# Patient Record
Sex: Female | Born: 1980 | Race: White | Hispanic: No | Marital: Single | Smoking: Never smoker
Health system: Southern US, Community
[De-identification: ages and names within clinical notes are randomized; demographics above are authoritative.]

## PROBLEM LIST (undated history)

## (undated) DIAGNOSIS — E039 Hypothyroidism, unspecified: Secondary | ICD-10-CM

## (undated) DIAGNOSIS — I10 Essential (primary) hypertension: Secondary | ICD-10-CM

## (undated) MED FILL — Ferumoxytol Inj 510 MG/17ML (30 MG/ML) (Elemental Fe): INTRAVENOUS | Qty: 17 | Status: AC

---

## 2013-06-10 ENCOUNTER — Emergency Department (HOSPITAL_COMMUNITY): Payer: Self-pay

## 2013-06-10 ENCOUNTER — Encounter (HOSPITAL_COMMUNITY): Payer: Self-pay | Admitting: *Deleted

## 2013-06-10 ENCOUNTER — Emergency Department (HOSPITAL_COMMUNITY)
Admission: EM | Admit: 2013-06-10 | Discharge: 2013-06-10 | Disposition: A | Discharge status: Home / Self Care | Payer: Self-pay | Attending: Emergency Medicine | Admitting: Emergency Medicine

## 2013-06-10 DIAGNOSIS — L02419 Cutaneous abscess of limb, unspecified: Secondary | ICD-10-CM | POA: Insufficient documentation

## 2013-06-10 DIAGNOSIS — W2209XA Striking against other stationary object, initial encounter: Secondary | ICD-10-CM | POA: Insufficient documentation

## 2013-06-10 DIAGNOSIS — Y939 Activity, unspecified: Secondary | ICD-10-CM | POA: Insufficient documentation

## 2013-06-10 DIAGNOSIS — Y929 Unspecified place or not applicable: Secondary | ICD-10-CM | POA: Insufficient documentation

## 2013-06-10 DIAGNOSIS — L0390 Cellulitis, unspecified: Secondary | ICD-10-CM

## 2013-06-10 MED ORDER — CEPHALEXIN 500 MG PO CAPS: 500.0000 mg | ORAL_CAPSULE | Freq: Four times a day (QID) | ORAL | Status: DC

## 2013-06-10 MED ORDER — SULFAMETHOXAZOLE-TRIMETHOPRIM 800-160 MG PO TABS: 1.0000 | ORAL_TABLET | Freq: Two times a day (BID) | ORAL | Status: DC

## 2013-06-10 NOTE — ED Notes (Signed)
Pt hit left leg against couch, continue to have swelling and legpain

## 2013-06-10 NOTE — ED Provider Notes (Addendum)
CSN: [id number]     Arrival date & time 06/10/13  2114 History     First MD Initiated Contact with Patient 06/10/13 2147     Chief Complaint  Patient presents with  . Leg Injury   (Consider location/radiation/quality/duration/timing/severity/associated sxs/prior Treatment) HPI This is a 32 year old female with no significant past history who presents with left leg pain. Patient states that she hit her left shin on a couch last week. Since that time she has had increasing leg pain and swelling. She has been ambulatory.  At the time of injury, she sustained a small abrasion and states it has gotten more red. She denies any fevers.  History reviewed. No pertinent past medical history. History reviewed. No pertinent past surgical history. No family history on file. History  Substance Use Topics  . Smoking status: Never Smoker   . Smokeless tobacco: Not on file  . Alcohol Use: No   OB History   Grav Para Term Preterm Abortions TAB SAB Ect Mult Living                 Review of Systems  Constitutional: Negative for fever.  Musculoskeletal: Negative for gait problem.  Neurological: Negative for weakness.  All other systems reviewed and are negative.    Allergies  Review of patient's allergies indicates no known allergies.  Home Medications   Current Outpatient Rx  Name  Route  Sig  Dispense  Refill  . ibuprofen (ADVIL,MOTRIN) 200 MG tablet   Oral   Take 200 mg by mouth every 6 (six) hours as needed for pain (pain).         . cephALEXin (KEFLEX) 500 MG capsule   Oral   Take 1 capsule (500 mg total) by mouth 4 (four) times daily.   28 capsule   0   . sulfamethoxazole-trimethoprim (SEPTRA DS) 800-160 MG per tablet   Oral   Take 1 tablet by mouth 2 (two) times daily.   28 tablet   0    BP 168/111  Pulse 74  Temp(Src) 98.4 F (36.9 C) (Oral)  Ht 5\' 2"  (1.575 m)  Wt 268 lb (121.564 kg)  BMI 49.01 kg/m2  SpO2 100%  LMP 04/04/2013 Physical Exam  Nursing note  and vitals reviewed. Constitutional: She is oriented to person, place, and time. She appears well-developed and well-nourished.  HENT:  Head: Normocephalic and atraumatic.  Neck: Neck supple.  Cardiovascular: Normal rate, regular rhythm and normal heart sounds.   Pulmonary/Chest: Effort normal. No respiratory distress. She has no wheezes.  Musculoskeletal: Normal range of motion.  No deformity noted to the left.  Neurological: She is alert and oriented to person, place, and time.  Skin: Skin is warm and dry. There is erythema.  Patient has a small area of erythema and swelling in the anterior shin associated with a small abrasion. The skin is warm.    Psychiatric: She has a normal mood and affect.    ED Course   Procedures (including critical care time)  Labs Reviewed - No data to display Dg Tibia/fibula Left  06/10/2013   *RADIOLOGY REPORT*  Clinical Data: Left leg injury, pain.  LEFT TIBIA AND FIBULA - 2 VIEW  Comparison: None  Findings: No acute bony abnormality.  Specifically, no fracture, subluxation, or dislocation.  Soft tissues are intact.  IMPRESSION: Negative.   Original Report Authenticated By: Charlett Nose, M.D.   1. Cellulitis     MDM  This is a 32 year old female who presents with leg  pain and swelling. She is nontoxic-appearing on exam and her vital signs are reassuring. If patient has evidence of abrasion with what is likely early cellulitis over the left shin. Plain films of this area are negative. Patient is afebrile. Given that she has had increased pain in her physical exam is concerning for infection, I will send her home on Bactrim and Keflex for 7 days. She will followup with her primary care physician.  After history, exam, and medical workup I feel the patient has been appropriately medically screened and is safe for discharge home. Pertinent diagnoses were discussed with the patient. Patient was given return precautions.   Shon Baton, MD 06/10/13  4098  Shon Baton, MD 06/10/13 [phone]

## 2013-06-10 NOTE — ED Notes (Signed)
Elevated left leg.

## 2013-10-28 ENCOUNTER — Encounter (HOSPITAL_BASED_OUTPATIENT_CLINIC_OR_DEPARTMENT_OTHER): Payer: Self-pay | Admitting: Emergency Medicine

## 2013-10-28 ENCOUNTER — Emergency Department (HOSPITAL_BASED_OUTPATIENT_CLINIC_OR_DEPARTMENT_OTHER): Payer: Self-pay

## 2013-10-28 ENCOUNTER — Emergency Department (HOSPITAL_BASED_OUTPATIENT_CLINIC_OR_DEPARTMENT_OTHER)
Admission: EM | Admit: 2013-10-28 | Discharge: 2013-10-28 | Disposition: A | Discharge status: Home / Self Care | Payer: Self-pay | Attending: Emergency Medicine | Admitting: Emergency Medicine

## 2013-10-28 DIAGNOSIS — S59909A Unspecified injury of unspecified elbow, initial encounter: Secondary | ICD-10-CM | POA: Insufficient documentation

## 2013-10-28 DIAGNOSIS — Y9389 Activity, other specified: Secondary | ICD-10-CM | POA: Insufficient documentation

## 2013-10-28 DIAGNOSIS — S59919A Unspecified injury of unspecified forearm, initial encounter: Principal | ICD-10-CM

## 2013-10-28 DIAGNOSIS — S6992XA Unspecified injury of left wrist, hand and finger(s), initial encounter: Secondary | ICD-10-CM

## 2013-10-28 DIAGNOSIS — W19XXXA Unspecified fall, initial encounter: Secondary | ICD-10-CM

## 2013-10-28 DIAGNOSIS — S6990XA Unspecified injury of unspecified wrist, hand and finger(s), initial encounter: Principal | ICD-10-CM | POA: Insufficient documentation

## 2013-10-28 DIAGNOSIS — Z79899 Other long term (current) drug therapy: Secondary | ICD-10-CM | POA: Insufficient documentation

## 2013-10-28 DIAGNOSIS — E039 Hypothyroidism, unspecified: Secondary | ICD-10-CM | POA: Insufficient documentation

## 2013-10-28 DIAGNOSIS — R296 Repeated falls: Secondary | ICD-10-CM | POA: Insufficient documentation

## 2013-10-28 DIAGNOSIS — Y929 Unspecified place or not applicable: Secondary | ICD-10-CM | POA: Insufficient documentation

## 2013-10-28 HISTORY — DX: Hypothyroidism, unspecified: E03.9

## 2013-10-28 MED ORDER — TRAMADOL HCL 50 MG PO TABS: 50.0000 mg | ORAL_TABLET | Freq: Four times a day (QID) | ORAL | Status: DC | PRN

## 2013-10-28 NOTE — Discharge Instructions (Signed)
Keep splint intact until Orthopedic follow up. Follow up with Dr. Tamera Punt for further evaluation of your possible fracture. Take Tramadol as needed for pain. Refer to attached documents for more information. Rest, ice and elevate your injury.

## 2013-10-28 NOTE — ED Provider Notes (Signed)
CSN: [id number]     Arrival date & time 10/28/13  1731 History   First MD Initiated Contact with Patient 10/28/13 1906     Chief Complaint  Patient presents with  . Wrist Pain   (Consider location/radiation/quality/duration/timing/severity/associated sxs/prior Treatment) HPI Comments: Patient is a 33 year old female who presents after a [place] that occurred last night when she fell off a stool. Since the fall, patient reports left wrist pain that is throbbing and severe without radiation. Movement of the wrist makes the pain worse. No alleviating factors. Patient denies any other injuries. Patient denies head trauma or LOC.   Patient is a 33 y.o. female presenting with wrist pain.  Wrist Pain Associated symptoms include arthralgias and joint swelling. Pertinent negatives include no abdominal pain, chest pain, chills, fatigue, fever, nausea, neck pain, vomiting or weakness.    Past Medical History  Diagnosis Date  . Hypothyroidism    History reviewed. No pertinent past surgical history. No family history on file. History  Substance Use Topics  . Smoking status: Never Smoker   . Smokeless tobacco: Not on file  . Alcohol Use: No   OB History   Grav Para Term Preterm Abortions TAB SAB Ect Mult Living                 Review of Systems  Constitutional: Negative for fever, chills and fatigue.  HENT: Negative for trouble swallowing.   Eyes: Negative for visual disturbance.  Respiratory: Negative for shortness of breath.   Cardiovascular: Negative for chest pain and palpitations.  Gastrointestinal: Negative for nausea, vomiting, abdominal pain and diarrhea.  Genitourinary: Negative for dysuria and difficulty urinating.  Musculoskeletal: Positive for arthralgias and joint swelling. Negative for neck pain.  Skin: Negative for color change.  Neurological: Negative for dizziness and weakness.  Psychiatric/Behavioral: Negative for dysphoric mood.    Allergies  Review of patient's  allergies indicates no known allergies.  Home Medications   Current Outpatient Rx  Name  Route  Sig  Dispense  Refill  . levothyroxine (SYNTHROID, LEVOTHROID) 75 MCG tablet   Oral   Take 75 mcg by mouth daily before breakfast.         . cephALEXin (KEFLEX) 500 MG capsule   Oral   Take 1 capsule (500 mg total) by mouth 4 (four) times daily.   28 capsule   0   . ibuprofen (ADVIL,MOTRIN) 200 MG tablet   Oral   Take 200 mg by mouth every 6 (six) hours as needed for pain (pain).         Marland Kitchen sulfamethoxazole-trimethoprim (SEPTRA DS) 800-160 MG per tablet   Oral   Take 1 tablet by mouth 2 (two) times daily.   28 tablet   0    BP 158/109  Pulse 78  Temp(Src) 98.3 F (36.8 C) (Oral)  Resp 20  Ht 5\' 2"  (1.575 m)  Wt 250 lb (113.399 kg)  BMI 45.71 kg/m2  SpO2 98% Physical Exam  Nursing note and vitals reviewed. Constitutional: She is oriented to person, place, and time. She appears well-developed and well-nourished. No distress.  HENT:  Head: Normocephalic and atraumatic.  Eyes: Conjunctivae and EOM are normal.  Neck: Normal range of motion.  Cardiovascular: Normal rate and regular rhythm.  Exam reveals no gallop and no friction rub.   No murmur heard. Pulmonary/Chest: Effort normal and breath sounds normal. She has no wheezes. She has no rales. She exhibits no tenderness.  Musculoskeletal:  Limited ROM of left wrist  due to pain. No obvious deformity. Left snuff box tenderness. Full ROM of digits of left hand.   Neurological: She is alert and oriented to person, place, and time. Coordination normal.  Speech is goal-oriented. Moves limbs without ataxia.   Skin: Skin is warm and dry.  Psychiatric: She has a normal mood and affect. Her behavior is normal.    ED Course  Procedures (including critical care time)  SPLINT APPLICATION Date/Time: 12/01/7822 3:38 PM Authorized by: Alvina Chou Consent: Verbal consent obtained. Risks and benefits: risks, benefits and  alternatives were discussed Consent given by: patient Splint applied by: orthopedic technician Location details: left wrist Splint type: velcro thumb spica Post-procedure: The splinted body part was neurovascularly unchanged following the procedure. Patient tolerance: Patient tolerated the procedure well with no immediate complications.     Labs Review Labs Reviewed - No data to display Imaging Review Dg Wrist Complete Left  10/28/2013   CLINICAL DATA:  [place] off a stool, injured left wrist last night  EXAM: LEFT WRIST - COMPLETE 3+ VIEW  COMPARISON:  None.  FINDINGS: There is no evidence of fracture or dislocation. There is no evidence of arthropathy or other focal bone abnormality. Soft tissues are unremarkable.  IMPRESSION: Negative.   Electronically Signed   By: Skipper Cliche M.D.   On: 10/28/2013 18:26    EKG Interpretation   None       MDM   1. Fall, initial encounter   2. Left wrist injury, initial encounter     7:27 PM Patient's xray unremarkable for acute changes. Patient has focal left snuff box tenderness. Patient will be splinted and treated as scaphoid fracture. No neurovascular compromise. Patient will be discharged with pain medication and instructions to follow up with Orthopedics. Vitals stable and patient afebrile. Patient denies any other injury.    Alvina Chou, [place] 10/28/13 [phone]

## 2013-10-28 NOTE — ED Notes (Signed)
Patient fell off a stool last night and injured her left wrist.  Radial pulse presents, CMS intact.

## 2013-10-28 NOTE — ED Provider Notes (Signed)
  Medical screening examination/treatment/procedure(s) were performed by non-physician practitioner and as supervising physician I was immediately available for consultation/collaboration.  EKG Interpretation   None          Carmin Muskrat, MD 10/28/13 2244

## 2015-01-23 ENCOUNTER — Encounter (HOSPITAL_BASED_OUTPATIENT_CLINIC_OR_DEPARTMENT_OTHER): Payer: Self-pay | Admitting: Emergency Medicine

## 2015-01-23 ENCOUNTER — Emergency Department (HOSPITAL_BASED_OUTPATIENT_CLINIC_OR_DEPARTMENT_OTHER)
Admission: EM | Admit: 2015-01-23 | Discharge: 2015-01-24 | Disposition: A | Discharge status: Home / Self Care | Payer: Medicaid Other | Attending: Emergency Medicine | Admitting: Emergency Medicine

## 2015-01-23 DIAGNOSIS — R51 Headache: Secondary | ICD-10-CM | POA: Diagnosis present

## 2015-01-23 DIAGNOSIS — E039 Hypothyroidism, unspecified: Secondary | ICD-10-CM | POA: Insufficient documentation

## 2015-01-23 DIAGNOSIS — L03116 Cellulitis of left lower limb: Secondary | ICD-10-CM | POA: Diagnosis not present

## 2015-01-23 DIAGNOSIS — Z79899 Other long term (current) drug therapy: Secondary | ICD-10-CM | POA: Diagnosis not present

## 2015-01-23 DIAGNOSIS — Z792 Long term (current) use of antibiotics: Secondary | ICD-10-CM | POA: Diagnosis not present

## 2015-01-23 DIAGNOSIS — R519 Headache, unspecified: Secondary | ICD-10-CM

## 2015-01-23 NOTE — ED Notes (Signed)
C/o ha since Tuesday,  Rash on left lower leg,  [place]

## 2015-01-23 NOTE — ED Notes (Signed)
Patient states that she went to the zoo 2 days ago and developed a headache with cold chills and a rash to her left leg. She took Migraine medication about 1 hour ago

## 2015-01-24 ENCOUNTER — Ambulatory Visit (HOSPITAL_BASED_OUTPATIENT_CLINIC_OR_DEPARTMENT_OTHER)
Admit: 2015-01-24 | Discharge: 2015-01-24 | Disposition: A | Discharge status: Home / Self Care | Payer: Medicaid Other | Attending: Emergency Medicine | Admitting: Emergency Medicine

## 2015-01-24 MED ORDER — CLINDAMYCIN HCL 150 MG PO CAPS: 450.0000 mg | ORAL_CAPSULE | Freq: Four times a day (QID) | ORAL | Status: DC

## 2015-01-24 MED ORDER — CLINDAMYCIN HCL 150 MG PO CAPS
ORAL_CAPSULE | ORAL | Status: AC
  Filled 2015-01-24: qty 3

## 2015-01-24 MED ORDER — DIPHENHYDRAMINE HCL 25 MG PO CAPS
50.0000 mg | ORAL_CAPSULE | Freq: Once | ORAL | Status: AC
  Administered 2015-01-24: 50 mg via ORAL
  Filled 2015-01-24: qty 2

## 2015-01-24 MED ORDER — METOCLOPRAMIDE HCL 10 MG PO TABS
10.0000 mg | ORAL_TABLET | Freq: Once | ORAL | Status: AC
  Administered 2015-01-24: 10 mg via ORAL
  Filled 2015-01-24: qty 1

## 2015-01-24 MED ORDER — ENOXAPARIN SODIUM 120 MG/0.8ML ~~LOC~~ SOLN
1.0000 mg/kg | Freq: Once | SUBCUTANEOUS | Status: AC
  Administered 2015-01-24: 120 mg via SUBCUTANEOUS
  Filled 2015-01-24: qty 0.8

## 2015-01-24 MED ORDER — CLINDAMYCIN HCL 150 MG PO CAPS
450.0000 mg | ORAL_CAPSULE | Freq: Once | ORAL | Status: AC
  Administered 2015-01-24: 450 mg via ORAL

## 2015-01-24 MED ORDER — NAPROXEN 250 MG PO TABS
500.0000 mg | ORAL_TABLET | Freq: Once | ORAL | Status: AC
  Administered 2015-01-24: 500 mg via ORAL
  Filled 2015-01-24: qty 2

## 2015-01-24 NOTE — Discharge Instructions (Signed)

## 2015-01-24 NOTE — ED Provider Notes (Signed)
CSN: [id number]     Arrival date & time 01/23/15  2303 History   First MD Initiated Contact with Patient 01/24/15 0015     Chief Complaint  Patient presents with  . Headache     (Consider location/radiation/quality/duration/timing/severity/associated sxs/prior Treatment) HPI Comments:  34 year old female who presents complaining of intermittent dull frontal headache since Tuesday, without associated, aggravating or alleviating symptoms, as well as 2 days of left lower leg swelling and one day of erythematous, burning rash on left lower extremity.  She is also had subjective fevers and chills for one day.  She denies numbness, weakness, neck pain or stiffness, nausea, vomiting or URI symptoms.   Patient is a 34 y.o. female presenting with headaches and rash. The history is provided by the patient. No language interpreter was used.  Headache Pain location:  Frontal Quality:  Dull Radiates to:  Does not radiate Onset quality:  Gradual Duration:  3 days Timing:  Intermittent Progression:  Waxing and waning Chronicity:  New Similar to prior headaches: no   Relieved by:  Nothing Worsened by:  Nothing Ineffective treatments:  None tried Associated symptoms: fever   Associated symptoms: no abdominal pain, no back pain, no congestion, no cough, no diarrhea, no fatigue, no focal weakness, no loss of balance, no myalgias, no nausea, no neck pain, no neck stiffness, no numbness, no photophobia, no seizures, no sinus pressure, no sore throat, no visual change, no vomiting and no weakness   Associated symptoms comment:  Rash to left lower extremity Rash Location:  Leg Leg rash location:  L lower leg Quality: painful and redness   Pain details:    Quality:  Hot   Onset quality:  Gradual   Duration:  2 days   Timing:  Constant   Progression:  Worsening Severity:  Moderate Onset quality:  Gradual Duration:  2 days Timing:  Constant Progression:  Worsening Context: pollen   Context: not  sick contacts and not sun exposure   Relieved by:  Nothing Worsened by:  Nothing tried Ineffective treatments:  Anti-itch cream Associated symptoms: fever and headaches   Associated symptoms: no abdominal pain, no diarrhea, no fatigue, no joint pain, no myalgias, no nausea, no shortness of breath, no sore throat and not vomiting     Past Medical History  Diagnosis Date  . Hypothyroidism    History reviewed. No pertinent past surgical history. History reviewed. No pertinent family history. History  Substance Use Topics  . Smoking status: Never Smoker   . Smokeless tobacco: Not on file  . Alcohol Use: No   OB History    No data available     Review of Systems  Constitutional: Positive for fever. Negative for chills, diaphoresis, activity change, appetite change and fatigue.  HENT: Negative for congestion, facial swelling, rhinorrhea, sinus pressure and sore throat.   Eyes: Negative for photophobia and discharge.  Respiratory: Negative for cough, chest tightness and shortness of breath.   Cardiovascular: Negative for chest pain, palpitations and leg swelling.  Gastrointestinal: Negative for nausea, vomiting, abdominal pain and diarrhea.  Endocrine: Negative for polydipsia and polyuria.  Genitourinary: Negative for dysuria, frequency, difficulty urinating and pelvic pain.  Musculoskeletal: Negative for myalgias, back pain, arthralgias, neck pain and neck stiffness.  Skin: Positive for rash. Negative for color change and wound.  Allergic/Immunologic: Negative for immunocompromised state.  Neurological: Positive for headaches. Negative for focal weakness, seizures, facial asymmetry, weakness, numbness and loss of balance.  Hematological: Does not bruise/bleed easily.  Psychiatric/Behavioral: Negative  for confusion and agitation.      Allergies  Review of patient's allergies indicates no known allergies.  Home Medications   Prior to Admission medications   Medication Sig  Start Date End Date Taking? Authorizing Provider  cephALEXin (KEFLEX) 500 MG capsule Take 1 capsule (500 mg total) by mouth 4 (four) times daily. 06/10/13   Merryl Hacker, MD  clindamycin (CLEOCIN) 150 MG capsule Take 3 capsules (450 mg total) by mouth every 6 (six) hours. For 1 full week 01/24/15   Ernestina Patches, MD  ibuprofen (ADVIL,MOTRIN) 200 MG tablet Take 200 mg by mouth every 6 (six) hours as needed for pain (pain).    Historical Provider, MD  levothyroxine (SYNTHROID, LEVOTHROID) 75 MCG tablet Take 75 mcg by mouth daily before breakfast.    Historical Provider, MD  sulfamethoxazole-trimethoprim (SEPTRA DS) 800-160 MG per tablet Take 1 tablet by mouth 2 (two) times daily. 06/10/13   Merryl Hacker, MD  traMADol (ULTRAM) 50 MG tablet Take 1 tablet (50 mg total) by mouth every 6 (six) hours as needed. 10/28/13   Kaitlyn Szekalski, PA-C   BP 134/82 mmHg  Pulse 105  Temp(Src) 99.7 F (37.6 C) (Oral)  Resp 18  Ht 5\' 2"  (1.575 m)  Wt 262 lb 6 oz (119.013 kg)  BMI 47.98 kg/m2  SpO2 97%  LMP  (LMP Unknown) Physical Exam  Constitutional: She is oriented to person, place, and time. She appears well-developed and well-nourished. No distress.  HENT:  Head: Normocephalic and atraumatic.  Mouth/Throat: No oropharyngeal exudate.  Eyes: Pupils are equal, round, and reactive to light.  Neck: Normal range of motion. Neck supple.  Cardiovascular: Normal rate, regular rhythm and normal heart sounds.  Exam reveals no gallop and no friction rub.   No murmur heard. Pulmonary/Chest: Effort normal and breath sounds normal. No respiratory distress. She has no wheezes. She has no rales.  Abdominal: Soft. Bowel sounds are normal. She exhibits no distension and no mass. There is no tenderness. There is no rebound and no guarding.  Musculoskeletal: Normal range of motion. She exhibits no edema or tenderness.       Legs: Neurological: She is alert and oriented to person, place, and time.  Skin: Skin is  warm and dry. Rash (well demarcated erythema of the left lower extremity between the ankle tenderness over the calf and one plus pitting edema) noted.  Psychiatric: She has a normal mood and affect.    ED Course  Procedures (including critical care time) Labs Review Labs Reviewed - No data to display  Imaging Review No results found.   EKG Interpretation None      MDM   Final diagnoses:  Acute nonintractable headache, unspecified headache type  Cellulitis of left lower extremity without foot    Patient is a 35 year old female who presents complaining of intermittent dull frontal headache since Tuesday, without associated, aggravating or alleviating symptoms, as well as 2 days of left lower leg swelling and one day of erythematous, burning rash on left lower extremity.  She is also had subjective fevers and chills for one day.  She denies numbness, weakness, neck pain or stiffness, nausea, vomiting or URI symptoms. On physical exam, patient has a temperature of 99.7, mild tachycardia of 110 is well-appearing and in no acute distress.  Cardiopulmonary and neurologic exams are benign.  She has rash as above and tenderness of the calf.  She's no history of cellulitis or DVT.  Clinically, given fever and well demarcated erythema.  I suspect rashes, cellulitis, though I will have her return in the morning for outpatient vascular study to rule out DVT.  Patient given by mouth migraine cocktail with improvement of headache and heart rate.  She's been given 1 dose of Lovenox in department as well as first dose of by mouth clindamycin.   Erythema has been marked.  History and physical exam are not consistent with CVA, TIA, subarachnoid hemorrhage or meningitis.  I feel she is safe to continue outpatient treatment.  Return precautions given for new or worsening symptoms including failure of rash to improve, worsening swelling, or red streaking, worsening headache, numbness, weakness.    Ernestina Patches, MD 01/24/15 [phone]

## 2018-02-17 DIAGNOSIS — D508 Other iron deficiency anemias: Secondary | ICD-10-CM

## 2018-02-17 DIAGNOSIS — N920 Excessive and frequent menstruation with regular cycle: Secondary | ICD-10-CM

## 2018-05-19 DIAGNOSIS — D508 Other iron deficiency anemias: Secondary | ICD-10-CM | POA: Diagnosis not present

## 2018-05-19 DIAGNOSIS — N920 Excessive and frequent menstruation with regular cycle: Secondary | ICD-10-CM | POA: Diagnosis not present

## 2018-09-10 ENCOUNTER — Emergency Department (HOSPITAL_BASED_OUTPATIENT_CLINIC_OR_DEPARTMENT_OTHER): Payer: Medicaid Other

## 2018-09-10 ENCOUNTER — Encounter (HOSPITAL_BASED_OUTPATIENT_CLINIC_OR_DEPARTMENT_OTHER): Payer: Self-pay | Admitting: *Deleted

## 2018-09-10 ENCOUNTER — Other Ambulatory Visit: Payer: Self-pay

## 2018-09-10 ENCOUNTER — Emergency Department (HOSPITAL_BASED_OUTPATIENT_CLINIC_OR_DEPARTMENT_OTHER)
Admission: EM | Admit: 2018-09-10 | Discharge: 2018-09-10 | Disposition: A | Discharge status: Home / Self Care | Payer: Medicaid Other | Attending: Emergency Medicine | Admitting: Emergency Medicine

## 2018-09-10 DIAGNOSIS — K429 Umbilical hernia without obstruction or gangrene: Secondary | ICD-10-CM

## 2018-09-10 DIAGNOSIS — I10 Essential (primary) hypertension: Secondary | ICD-10-CM | POA: Diagnosis not present

## 2018-09-10 DIAGNOSIS — D279 Benign neoplasm of unspecified ovary: Secondary | ICD-10-CM | POA: Diagnosis not present

## 2018-09-10 DIAGNOSIS — R1084 Generalized abdominal pain: Secondary | ICD-10-CM | POA: Diagnosis present

## 2018-09-10 DIAGNOSIS — Z79899 Other long term (current) drug therapy: Secondary | ICD-10-CM | POA: Diagnosis not present

## 2018-09-10 DIAGNOSIS — K760 Fatty (change of) liver, not elsewhere classified: Secondary | ICD-10-CM | POA: Diagnosis not present

## 2018-09-10 DIAGNOSIS — E039 Hypothyroidism, unspecified: Secondary | ICD-10-CM | POA: Diagnosis not present

## 2018-09-10 DIAGNOSIS — R911 Solitary pulmonary nodule: Secondary | ICD-10-CM | POA: Insufficient documentation

## 2018-09-10 DIAGNOSIS — D369 Benign neoplasm, unspecified site: Secondary | ICD-10-CM

## 2018-09-10 DIAGNOSIS — R509 Fever, unspecified: Secondary | ICD-10-CM

## 2018-09-10 DIAGNOSIS — R7401 Elevation of levels of liver transaminase levels: Secondary | ICD-10-CM

## 2018-09-10 DIAGNOSIS — D49511 Neoplasm of unspecified behavior of right kidney: Secondary | ICD-10-CM | POA: Diagnosis not present

## 2018-09-10 DIAGNOSIS — N2889 Other specified disorders of kidney and ureter: Secondary | ICD-10-CM

## 2018-09-10 DIAGNOSIS — N390 Urinary tract infection, site not specified: Secondary | ICD-10-CM | POA: Insufficient documentation

## 2018-09-10 DIAGNOSIS — R740 Nonspecific elevation of levels of transaminase and lactic acid dehydrogenase [LDH]: Secondary | ICD-10-CM | POA: Insufficient documentation

## 2018-09-10 HISTORY — DX: Essential (primary) hypertension: I10

## 2018-09-10 LAB — COMPREHENSIVE METABOLIC PANEL
ALT: 60 U/L — AB (ref 0–44)
AST: 88 U/L — AB (ref 15–41)
Albumin: 4.4 g/dL (ref 3.5–5.0)
Alkaline Phosphatase: 61 U/L (ref 38–126)
Anion gap: 11 (ref 5–15)
BUN: 9 mg/dL (ref 6–20)
CO2: 28 mmol/L (ref 22–32)
Calcium: 9.3 mg/dL (ref 8.9–10.3)
Chloride: 100 mmol/L (ref 98–111)
Creatinine, Ser: 1.04 mg/dL — ABNORMAL HIGH (ref 0.44–1.00)
GFR calc non Af Amer: >60 mL/min (ref >60)
Glucose, Bld: 96 mg/dL (ref 70–99)
POTASSIUM: 3.4 mmol/L — AB (ref 3.5–5.1)
SODIUM: 139 mmol/L (ref 135–145)
Total Bilirubin: 0.5 mg/dL (ref 0.3–1.2)
Total Protein: 8.5 g/dL — ABNORMAL HIGH (ref 6.5–8.1)

## 2018-09-10 LAB — URINALYSIS, MICROSCOPIC (REFLEX)

## 2018-09-10 LAB — URINALYSIS, ROUTINE W REFLEX MICROSCOPIC
Bilirubin Urine: NEGATIVE
Glucose, UA: NEGATIVE mg/dL
KETONES UR: NEGATIVE mg/dL
Nitrite: NEGATIVE
PH: 6.5 (ref 5.0–8.0)
Protein, ur: NEGATIVE mg/dL
Specific Gravity, Urine: 1.01 (ref 1.005–1.030)

## 2018-09-10 LAB — CBC WITH DIFFERENTIAL/PLATELET
ABS IMMATURE GRANULOCYTES: 0.06 10*3/uL (ref 0.00–0.07)
Basophils Absolute: 0 10*3/uL (ref 0.0–0.1)
Basophils Relative: 0 %
Eosinophils Absolute: 0.2 10*3/uL (ref 0.0–0.5)
Eosinophils Relative: 1 %
HCT: 35.9 % — ABNORMAL LOW (ref 36.0–46.0)
HEMOGLOBIN: 11.4 g/dL — AB (ref 12.0–15.0)
IMMATURE GRANULOCYTES: 0 %
LYMPHS PCT: 9 %
Lymphs Abs: 1.3 10*3/uL (ref 0.7–4.0)
MCH: 31.3 pg (ref 26.0–34.0)
MCHC: 31.8 g/dL (ref 30.0–36.0)
MCV: 98.6 fL (ref 80.0–100.0)
MONO ABS: 0.9 10*3/uL (ref 0.1–1.0)
Monocytes Relative: 6 %
NEUTROS ABS: 12.5 10*3/uL — AB (ref 1.7–7.7)
NEUTROS PCT: 84 %
PLATELETS: 292 10*3/uL (ref 150–400)
RBC: 3.64 MIL/uL — AB (ref 3.87–5.11)
RDW: 14.1 % (ref 11.5–15.5)
WBC: 15 10*3/uL — AB (ref 4.0–10.5)
nRBC: 0 % (ref 0.0–0.2)

## 2018-09-10 LAB — WET PREP, GENITAL
Clue Cells Wet Prep HPF POC: NONE SEEN
SPERM: NONE SEEN
TRICH WET PREP: NONE SEEN
Yeast Wet Prep HPF POC: NONE SEEN

## 2018-09-10 LAB — PREGNANCY, URINE: Preg Test, Ur: NEGATIVE

## 2018-09-10 LAB — LIPASE, BLOOD: Lipase: 39 U/L (ref 11–51)

## 2018-09-10 MED ORDER — SODIUM CHLORIDE 0.9 % IV BOLUS
1000.0000 mL | Freq: Once | INTRAVENOUS | Status: AC
  Administered 2018-09-10: 1000 mL via INTRAVENOUS

## 2018-09-10 MED ORDER — IOPAMIDOL (ISOVUE-300) INJECTION 61%
100.0000 mL | Freq: Once | INTRAVENOUS | Status: AC | PRN
  Administered 2018-09-10: 100 mL via INTRAVENOUS

## 2018-09-10 MED ORDER — ACETAMINOPHEN 325 MG PO TABS
650.0000 mg | ORAL_TABLET | Freq: Once | ORAL | Status: AC
  Administered 2018-09-10: 650 mg via ORAL
  Filled 2018-09-10: qty 2

## 2018-09-10 MED ORDER — SODIUM CHLORIDE 0.9 % IV SOLN
INTRAVENOUS | Status: DC | PRN
  Administered 2018-09-10: 250 mL via INTRAVENOUS

## 2018-09-10 MED ORDER — HYDROCODONE-ACETAMINOPHEN 5-325 MG PO TABS: 1.0000 | ORAL_TABLET | Freq: Four times a day (QID) | ORAL | 0 refills | Status: DC | PRN

## 2018-09-10 MED ORDER — CEPHALEXIN 500 MG PO CAPS: 500.0000 mg | ORAL_CAPSULE | Freq: Two times a day (BID) | ORAL | 0 refills | Status: AC

## 2018-09-10 MED ORDER — SODIUM CHLORIDE 0.9 % IV SOLN
1.0000 g | Freq: Once | INTRAVENOUS | Status: AC
  Administered 2018-09-10: 1 g via INTRAVENOUS
  Filled 2018-09-10: qty 10

## 2018-09-10 NOTE — ED Notes (Signed)
Patient transported to X-ray 

## 2018-09-10 NOTE — ED Provider Notes (Signed)
[facility] EMERGENCY DEPARTMENT Provider Note   CSN: [id number] Arrival date & time: 09/10/18  1311     History   Chief Complaint Chief Complaint  Patient presents with  . Back Pain  . Abdominal Pain    HPI Erica Carr is a 37 y.o. female who presents today for evaluation of back and abdominal pain.  She reports that her right side of her back has been hurting for about one week.  She also reports that she has had a knot above her bellybutton that she is noticed over the past few days and it has become significantly more painful.  She also reports chills, dysuria, urinary frequency, urgency, and malodorous vaginal discharge all over the same 1 week period.  She denies any shortness of breath.  She has had a cough and nasal congestion for approximately 2 weeks.  No nausea vomiting or diarrhea.  She did not get a flu shot this year and is not interested in getting one.  She states that she has not had sexual intercourse in 1 year  HPI  Past Medical History:  Diagnosis Date  . Hypertension   . Hypothyroidism     There are no active problems to display for this patient.   History reviewed. No pertinent surgical history.   OB History   None      Home Medications    Prior to Admission medications   Medication Sig Start Date End Date Taking? Authorizing Provider  amLODipine (NORVASC) 5 MG tablet Take 5 mg by mouth daily.   Yes [provider]  levothyroxine (SYNTHROID, LEVOTHROID) 75 MCG tablet Take 75 mcg by mouth daily before breakfast.   Yes [provider]  cephALEXin (KEFLEX) 500 MG capsule Take 1 capsule (500 mg total) by mouth 2 (two) times daily for 10 days. 09/10/18 09/20/18  Lorin Glass, PA-C  clindamycin (CLEOCIN) 150 MG capsule Take 3 capsules (450 mg total) by mouth every 6 (six) hours. For 1 full week 01/24/15   Ernestina Patches, MD  HYDROcodone-acetaminophen (NORCO/VICODIN) 5-325 MG tablet Take 1 tablet by mouth every 6  (six) hours as needed for severe pain. 09/10/18   Lorin Glass, PA-C  ibuprofen (ADVIL,MOTRIN) 200 MG tablet Take 200 mg by mouth every 6 (six) hours as needed for pain (pain).    [provider]  sulfamethoxazole-trimethoprim (SEPTRA DS) 800-160 MG per tablet Take 1 tablet by mouth 2 (two) times daily. 06/10/13   Horton, Barbette Hair, MD  traMADol (ULTRAM) 50 MG tablet Take 1 tablet (50 mg total) by mouth every 6 (six) hours as needed. 10/28/13   Alvina Chou, PA-C    Family History No family history on file.  Social History Social History   Tobacco Use  . Smoking status: Never Smoker  . Smokeless tobacco: Never Used  Substance Use Topics  . Alcohol use: No  . Drug use: No     Allergies   Patient has no known allergies.   Review of Systems Review of Systems   Physical Exam Updated Vital Signs BP 124/64   Pulse (!) 104   Temp (!) 100.6 F (38.1 C) (Oral)   Resp (!) 21   Ht 5' 2" (1.575 m)   Wt 122.5 kg   LMP 08/28/2018 (Approximate)   SpO2 97%   BMI 49.38 kg/m   Physical Exam  Constitutional: She is oriented to person, place, and time. She appears well-developed and well-nourished.  Non-toxic appearance. She does not appear ill. No  distress.  HENT:  Head: Normocephalic and atraumatic.  Mouth/Throat: Oropharynx is clear and moist.  Eyes: Conjunctivae are normal.  Neck: Neck supple.  Cardiovascular: Normal rate and regular rhythm.  No murmur heard. Pulmonary/Chest: Effort normal and breath sounds normal. No respiratory distress.  Abdominal: Soft. Bowel sounds are normal. There is tenderness in the periumbilical area and suprapubic area. There is no rigidity, no rebound, no guarding and no CVA tenderness.  Small periumbilical swelling, TTP, mildly erythematous.   Genitourinary:  Genitourinary Comments: Exam performed with patient's primary RN as chaperone.  Normal external female genitalia.  No discharge in the vaginal canal, however there is  foul odor.  Generalized tenderness to palpation, however no true cervical motion tenderness.  Cervix is closed.  Musculoskeletal: She exhibits no edema.  Generalized tenderness to palpation over right-sided paraspinal lumbar muscles.    Neurological: She is alert and oriented to person, place, and time.  Skin: Skin is warm and dry.  Psychiatric: She has a normal mood and affect. Her behavior is normal.  Nursing note and vitals reviewed.    ED Treatments / Results  Labs (all labs ordered are listed, but only abnormal results are displayed) Labs Reviewed  WET PREP, GENITAL - Abnormal; Notable for the following components:      Result Value   WBC, Wet Prep HPF POC FEW (*)    All other components within normal limits  URINALYSIS, ROUTINE W REFLEX MICROSCOPIC - Abnormal; Notable for the following components:   Hgb urine dipstick SMALL (*)    Leukocytes, UA TRACE (*)    All other components within normal limits  URINALYSIS, MICROSCOPIC (REFLEX) - Abnormal; Notable for the following components:   Bacteria, UA FEW (*)    All other components within normal limits  COMPREHENSIVE METABOLIC PANEL - Abnormal; Notable for the following components:   Potassium 3.4 (*)    Creatinine, Ser 1.04 (*)    Total Protein 8.5 (*)    AST 88 (*)    ALT 60 (*)    All other components within normal limits  CBC WITH DIFFERENTIAL/PLATELET - Abnormal; Notable for the following components:   WBC 15.0 (*)    RBC 3.64 (*)    Hemoglobin 11.4 (*)    HCT 35.9 (*)    Neutro Abs 12.5 (*)    All other components within normal limits  URINE CULTURE  PREGNANCY, URINE  LIPASE, BLOOD  GC/CHLAMYDIA PROBE AMP ([place]) NOT AT [facility]    EKG None  Radiology Dg Chest 2 View  Result Date: 09/10/2018 CLINICAL DATA:  Cough, fever EXAM: CHEST - 2 VIEW COMPARISON:  None. FINDINGS: Heart and mediastinal contours are within normal limits. No focal opacities or effusions. No acute bony abnormality. IMPRESSION: No active  cardiopulmonary disease. Electronically Signed   By: Rolm Baptise M.D.   On: 09/10/2018 19:56   Ct Abdomen Pelvis W Contrast  Result Date: 09/10/2018 CLINICAL DATA:  37 year old with umbilical mass and surrounding erythema. EXAM: CT ABDOMEN AND PELVIS WITH CONTRAST TECHNIQUE: Multidetector CT imaging of the abdomen and pelvis was performed using the standard protocol following bolus administration of intravenous contrast. CONTRAST:  138m ISOVUE-300 IOPAMIDOL (ISOVUE-300) INJECTION 61% COMPARISON:  02/06/2009 FINDINGS: Lower chest: Few densities at the left lower lobe are suggestive for atelectasis. No pleural effusions. Punctate peripheral nodular density at the left lung base is probably an incidental finding that measures 3 mm on sequence 6, image 115. Hepatobiliary: Diffusely decreased attenuation of the liver. Portal venous system is  patent. No focal liver lesion. Normal appearance of the gallbladder. No biliary dilatation. Pancreas: Unremarkable. No pancreatic ductal dilatation or surrounding inflammatory changes. Spleen: Normal in size without focal abnormality. Adrenals/Urinary Tract: Normal adrenal glands. 1.9 cm low-density structure in the mid right kidney pelvis is slightly heterogeneous and difficult to exclude an internal septation. Hounsfield units are borderline to be considered a cyst measuring roughly 23. Negative for hydronephrosis. Urinary bladder is unremarkable. Normal appearance of the left kidney. Stomach/Bowel: Stomach is within normal limits. Appendix appears normal. No evidence of bowel wall thickening, distention, or inflammatory changes. Vascular/Lymphatic: Atherosclerotic calcifications in the abdominal aorta without aneurysm. Few prominent lymph nodes near the distal external iliac nodal chain are nonspecific and could be reactive. Again noted are multiple small lymph nodes in the periaortic region. Reproductive: Again noted is a fat attenuating lesion in the right adnexa. This  lesion measures 4.2 x 3.3 cm on sequence 2, image 60. Findings are compatible with an ovarian dermoid tumor. Uterus and left adnexa are unremarkable. Other: Small inguinal hernias containing fat. Periumbilical hernia with inflammation. This periumbilical hernia contains fat. Small amount of inflammation along the anterior abdominal cavity adjacent to the hernia. There is no bowel within this ventral hernia. Negative for free fluid. Negative for free air. Musculoskeletal: No acute bone abnormality. IMPRESSION: 1. Periumbilical hernia with inflammatory changes. This periumbilical hernia contains fat. No bowel involvement. 2. **An incidental finding of potential clinical significance has been found. Slightly heterogeneous 1.9 cm low-density structure in the right kidney. Hounsfield units are borderline for a cyst measuring around 23. Cannot exclude a small internal septation or enhancement within this structure. Findings likely represent a complex cyst but indeterminate. Recommend further characterization with MRI, with and without contrast.** 3. Low-attenuation of the liver and findings are compatible with steatosis. 4. 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 5. Punctate nodular density at the left lung base. This is likely an incidental finding in a patient of this age. No follow-up needed if patient is low-risk. Non-contrast chest CT can be considered in 12 months if patient is high-risk. This recommendation follows the consensus statement: Guidelines for Management of Incidental Pulmonary Nodules Detected on CT Images: From the Fleischner Society 2017; Radiology 2017; 284:228-243. Electronically Signed   By: Markus Daft M.D.   On: 09/10/2018 16:38    Procedures Procedures (including critical care time)  Medications Ordered in ED Medications  0.9 %  sodium chloride infusion (250 mLs Intravenous New Bag/Given 09/10/18 1952)  sodium chloride 0.9 % bolus 1,000 mL (  Intravenous Stopped 09/10/18 1552)  iopamidol (ISOVUE-300) 61 % injection 100 mL (100 mLs Intravenous Contrast Given 09/10/18 1538)  acetaminophen (TYLENOL) tablet 650 mg (650 mg Oral Given 09/10/18 1823)  cefTRIAXone (ROCEPHIN) 1 g in sodium chloride 0.9 % 100 mL IVPB (1 g Intravenous New Bag/Given 09/10/18 1954)     Initial Impression / Assessment and Plan / ED Course  I have reviewed the triage vital signs and the nursing notes.  Pertinent labs & imaging results that were available during my care of the patient were reviewed by me and considered in my medical decision making (see chart for details).  Clinical Course as of Sep 11 2027  Nancy Fetter Sep 10, 2018  1826 Spoke with Dr. Marlou Starks from general surgery at [facility].  He recommends outpatient follow up in the next week for her fat containing hernia.    [EH]  1828 Patient was not febrile upon  arrival.  She has not had a flu shot   Temp(!): 101.8 F (38.8 C) [EH]    Clinical Course User Index [EH] Lorin Glass, PA-C   Patient presents today for evaluation of abdominal and back pain.  Initially she was afebrile, however while in the department she developed a fever and mild tachycardia.  Labs were obtained, significant for leukocytosis of 15.0, hemoglobin of 11.4.  Her AST and ALT are both mildly elevated at 88 and 60 respectively.  Alk phos is not elevated, bilirubin is normal, and she does not have any specific pain localized in the right upper quadrant.  She does have a small umbilical swelling that is tender to palpation.  CT was obtained showing a fat-containing hernia without involvement of bowels.  I spoke with Dr. Marlou Starks from general surgery at [facility] who states no need for emergent surgical intervention, outpatient follow-up.  CT scan also revealed a significant number of incidental findings, patient was informed of all of these, and the appropriate follow-up for them.  The summary was pasted directly into her discharge papers  so she has this information.  She was treated with 1 L normal saline in the department.  She was also treated with Tylenol for fever and headache.  She does have slight transaminitis, however given the relatively mild nature of this Tylenol is not contraindicated.  Lipase is not elevated.  Wet prep showed few white blood cells, however she does not have frank discharge, or cervical motion tenderness.  Her urine showed few bacteria, however shows right the red blood cells, trace leukocytes.  Given symptoms including dysuria, increased frequency or urgency, hematuria, in addition to fever, and leukocytosis feel that it is most appropriate to treat patient with antibiotics.  Urine culture has been sent.  She is treated with a dose of Rocephin in the emergency room, and given a prescription for Keflex at home.  She is given prescription for 10 days of Keflex reported back pain, however she does not have true CVA tenderness to percussion. PMP was queried for the patient.  She is given a short course of Vicodin for home use due to the fat-containing hernia.  She is instructed on very strict return precautions and states her understanding.  She is to follow-up with her primary care doctor in the next 1 to 2 days.  At shift change care was transferred to Dr. Rex Kras who will follow pending studies, re-evaulate and determine disposition.     Final Clinical Impressions(s) / ED Diagnoses   Final diagnoses:  Lower urinary tract infectious disease  Fever, unspecified fever cause  Nodule of left lung  Dermoid tumor  Transaminitis  Steatosis of liver  Renal mass, right  Periumbilical hernia    ED Discharge Orders         Ordered    cephALEXin (KEFLEX) 500 MG capsule  2 times daily     09/10/18 2001    HYDROcodone-acetaminophen (NORCO/VICODIN) 5-325 MG tablet  Every 6 hours PRN     09/10/18 2001           Lorin Glass, Hershal Coria 09/10/18 2028    Little, Wenda Overland, MD 09/12/18 1401

## 2018-09-10 NOTE — ED Triage Notes (Signed)
Pt c/o back pain x 1 week. States she noticed a "knot" above her belly button. Also reports dysuria frequency, chills and vaginal discharge.

## 2018-09-10 NOTE — Discharge Instructions (Addendum)
Please follow-up with your primary care provider in the next 1 to 2 days.  If your symptoms worsen or you have additional concerns please seek additional medical care and evaluation.  Your urine has been sent for culture.  As we discussed you have multiple findings on your CT scan.  You do have a hernia around your bellybutton which contains fat and no intestines.  Please schedule an appointment with general surgery for further evaluation.  You have multiple incidental findings, including a left lung nodule, a dermoid mass on your right ovary, elevation of your liver enzymes, steatosis of liver, and a right sided renal mass.  These findings are summarized below.  You need to follow-up with both your primary care doctor and OB/GYN for additional evaluation.  I have given you information for OB/GYN.  You are being prescribed a medication which may make you sleepy. For 24 hours after one dose please do not drive, operate heavy machinery, care for a small child with out another adult present, or perform any activities that may cause harm to you or someone else if you were to fall asleep or be impaired.   You may have diarrhea from the antibiotics.  It is very important that you continue to take the antibiotics even if you get diarrhea unless a medical professional tells you that you may stop taking them.  If you stop too early the bacteria you are being treated for will become stronger and you may need different, more powerful antibiotics that have more side effects and worsening diarrhea.  Please stay well hydrated and consider probiotics as they may decrease the severity of your diarrhea.  Please be aware that if you take any hormonal contraception (birth control pills, nexplanon, the ring, etc) that your birth control will not work while you are taking antibiotics and you need to use back up protection as directed on the birth control medication information insert.      1. Periumbilical hernia with  inflammatory changes. This  periumbilical hernia contains fat. No bowel involvement.  2. **An incidental finding of potential clinical significance has  been found. Slightly heterogeneous 1.9 cm low-density structure in  the right kidney. Hounsfield units are borderline for a cyst  measuring around 23. Cannot exclude a small internal septation or  enhancement within this structure. Findings likely represent a  complex cyst but indeterminate. Recommend further characterization  with MRI, with and without contrast.**  3. Low-attenuation of the liver and findings are compatible with  steatosis.  4. Right ovarian dermoid tumor. This dermoid tumor was present on  the exam from 2010.  5. Punctate nodular density at the left lung base. This is likely an  incidental finding in a patient of this age. No follow-up needed if  patient is low-risk. Non-contrast chest CT can be considered in 12  months if patient is high-risk. This recommendation follows the  consensus statement: Guidelines for Management of Incidental  Pulmonary Nodules Detected on CT Images: From the Fleischner Society  2017; Radiology 2017; 284:228-243.

## 2018-09-11 LAB — GC/CHLAMYDIA PROBE AMP (~~LOC~~) NOT AT ARMC
CHLAMYDIA, DNA PROBE: NEGATIVE
NEISSERIA GONORRHEA: NEGATIVE

## 2018-09-12 LAB — URINE CULTURE

## 2018-12-12 DIAGNOSIS — D508 Other iron deficiency anemias: Secondary | ICD-10-CM | POA: Diagnosis not present

## 2018-12-12 DIAGNOSIS — N920 Excessive and frequent menstruation with regular cycle: Secondary | ICD-10-CM | POA: Diagnosis not present

## 2019-03-12 DIAGNOSIS — N920 Excessive and frequent menstruation with regular cycle: Secondary | ICD-10-CM

## 2019-03-12 DIAGNOSIS — D500 Iron deficiency anemia secondary to blood loss (chronic): Secondary | ICD-10-CM

## 2019-05-14 DIAGNOSIS — D500 Iron deficiency anemia secondary to blood loss (chronic): Secondary | ICD-10-CM

## 2019-10-28 IMAGING — CT CT ABD-PELV W/ CM
2 of 4 series · 15 of 46 positions shown, 17 images · IV contrast (iopamidol)
Comparison: 02/06/2009

CLINICAL DATA: 37-year-old with umbilical mass and surrounding
erythema.

EXAM:
CT ABDOMEN AND PELVIS WITH CONTRAST
TECHNIQUE: Multidetector CT imaging of the abdomen and pelvis was performed
using the standard protocol following bolus administration of
intravenous contrast.
CONTRAST:  100mL 6UVZ0L-Y00 IOPAMIDOL (6UVZ0L-Y00) INJECTION 61%

[Series 2: axial st · axial · 0.91mm/px · z∈[-584,-138]mm · 12 of 99 slices shown, 14 images]
[im 5/99  soft-tissue]
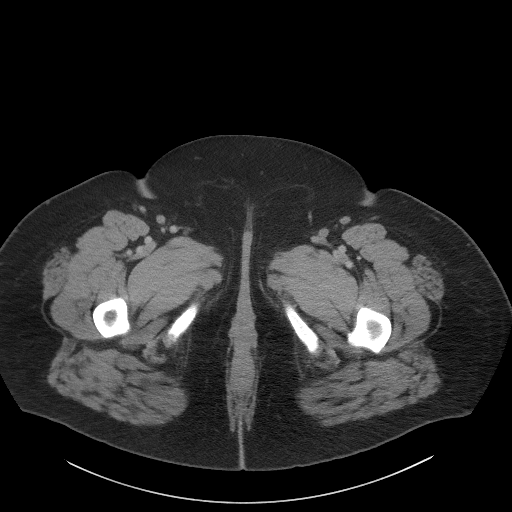
[im 5/99  bone]
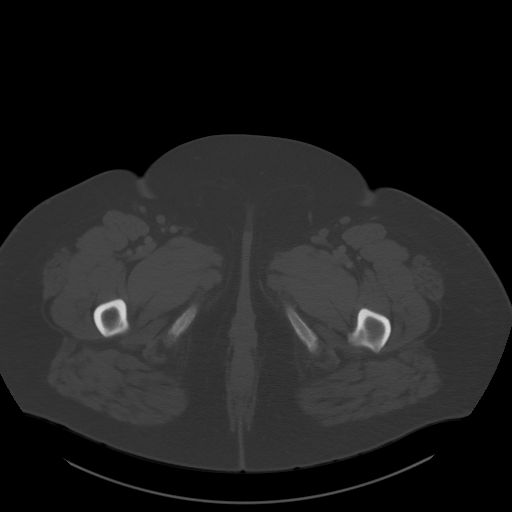
[im 13/99  soft-tissue]
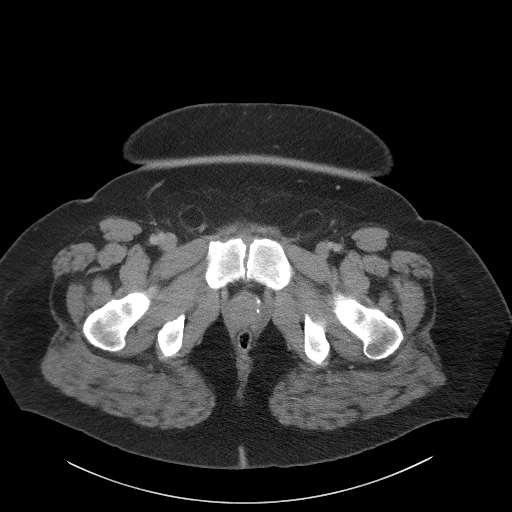
[im 21/99  soft-tissue]
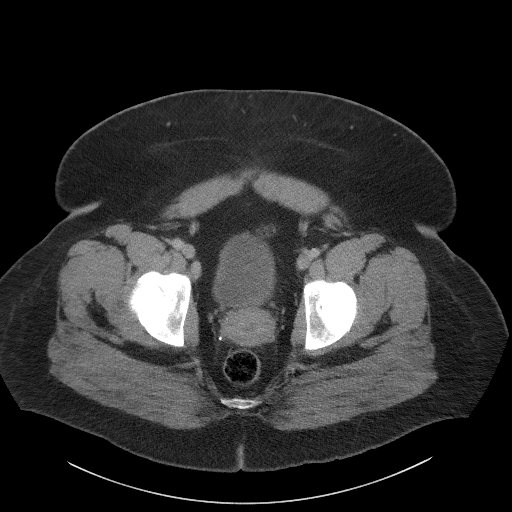
[im 29/99  soft-tissue]
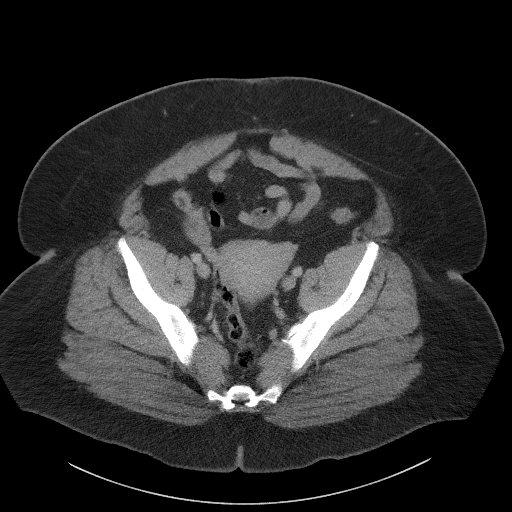
[im 37/99  soft-tissue]
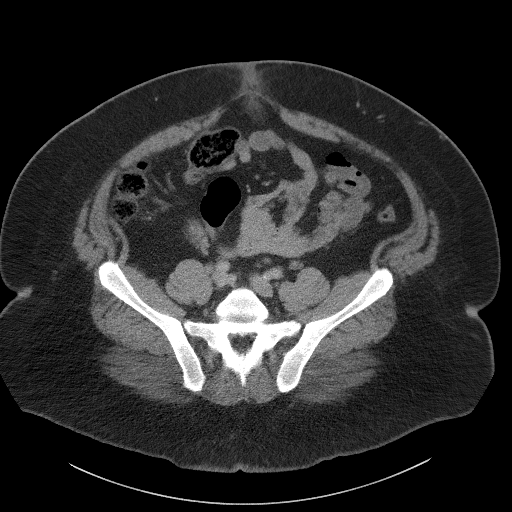
[im 45/99  soft-tissue]
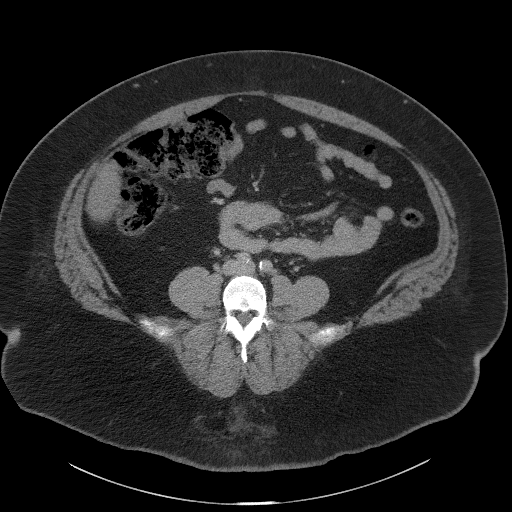
[im 54/99  soft-tissue]
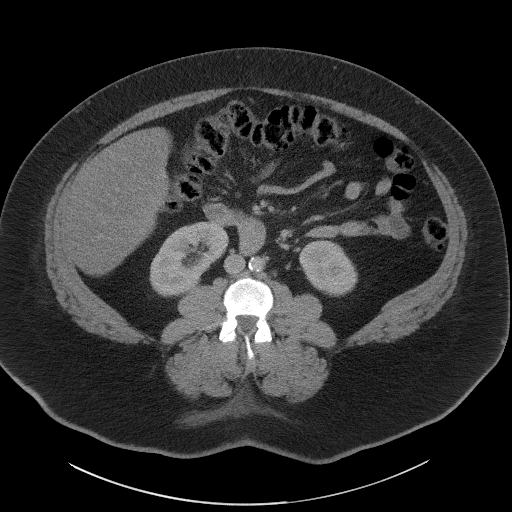
[im 62/99  soft-tissue]
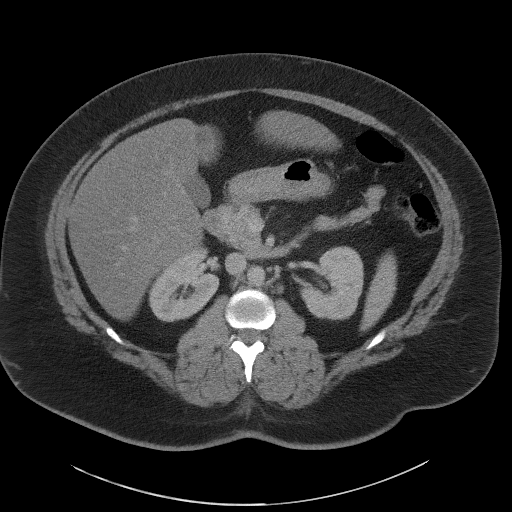
[im 70/99  soft-tissue]
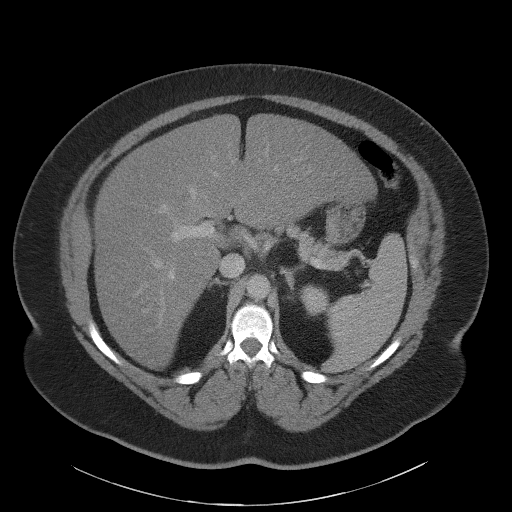
[im 70/99  bone]
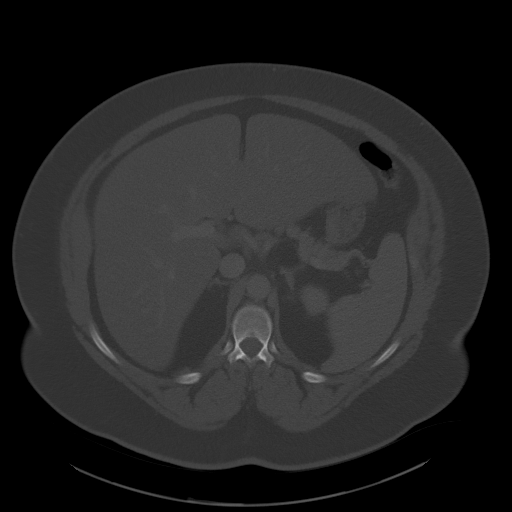
[im 78/99  soft-tissue]
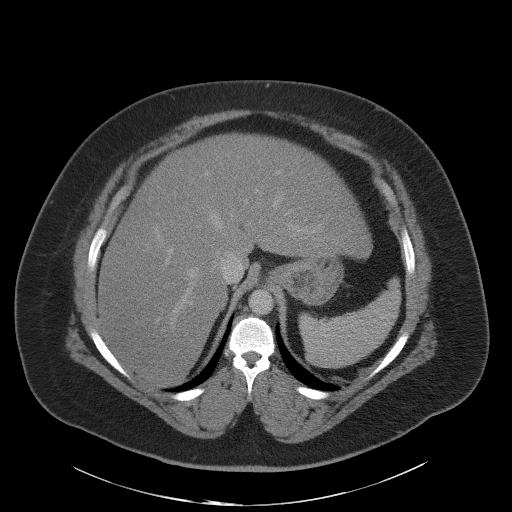
[im 86/99  soft-tissue]
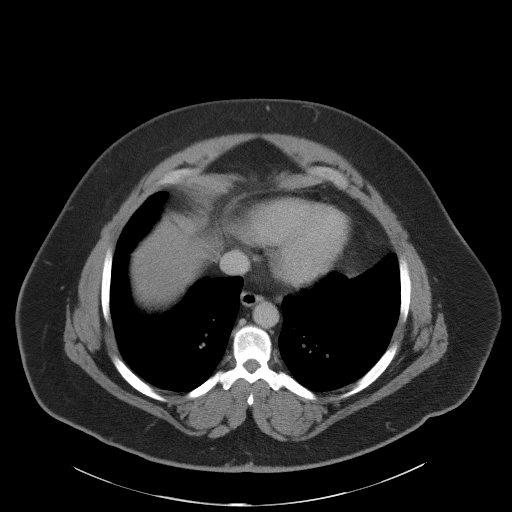
[im 94/99  soft-tissue]
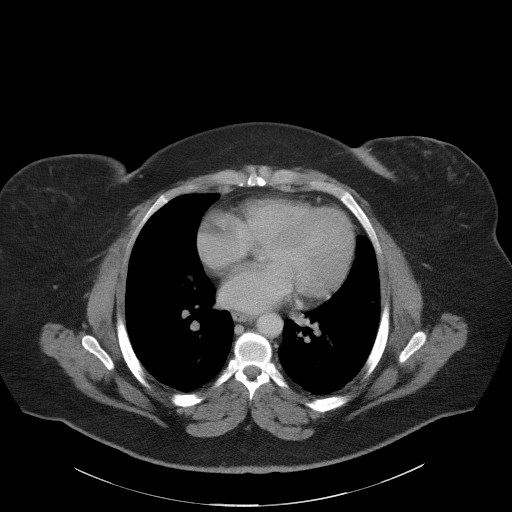

[Series 5: coronal st · coronal · 0.94mm/px · 3 of 126 slices shown]
[im 42/126  soft-tissue]
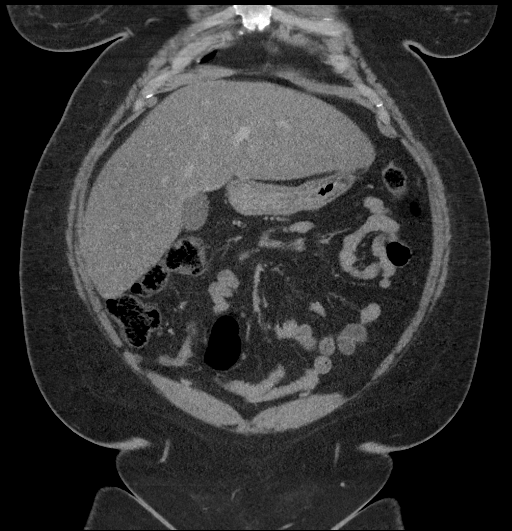
[im 56/126  soft-tissue]
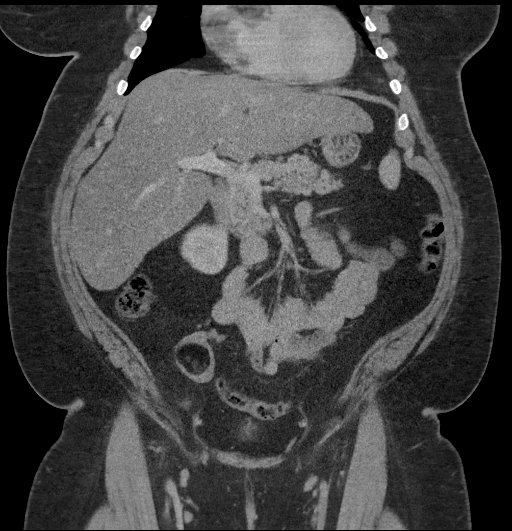
[im 70/126  soft-tissue]
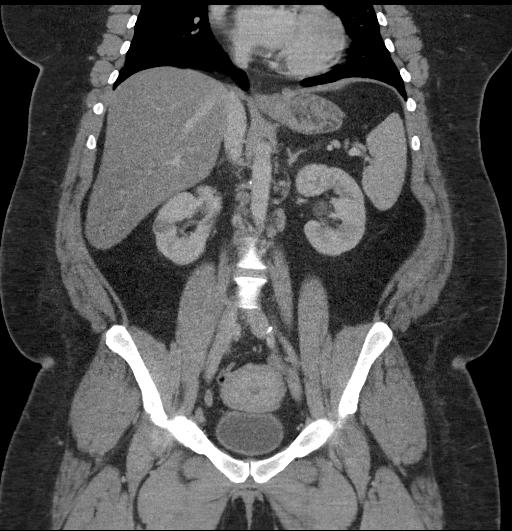

[15 of 46 positions shown; findings below may reference images not displayed]

FINDINGS: Lower chest: Few densities at the left lower lobe are suggestive for
atelectasis. No pleural effusions. Punctate peripheral nodular
density at the left lung base is probably an incidental finding that
measures 3 mm on sequence 6, image 115.

Hepatobiliary: Diffusely decreased attenuation of the liver. Portal
venous system is patent. No focal liver lesion. Normal appearance of
the gallbladder. No biliary dilatation.

Pancreas: Unremarkable. No pancreatic ductal dilatation or
surrounding inflammatory changes.

Spleen: Normal in size without focal abnormality.

Adrenals/Urinary Tract: Normal adrenal glands. 1.9 cm low-density
structure in the mid right kidney pelvis is slightly heterogeneous
and difficult to exclude an internal septation. Hounsfield units are
borderline to be considered a cyst measuring roughly 23. Negative
for hydronephrosis. Urinary bladder is unremarkable. Normal
appearance of the left kidney.

Stomach/Bowel: Stomach is within normal limits. Appendix appears
normal. No evidence of bowel wall thickening, distention, or
inflammatory changes.

Vascular/Lymphatic: Atherosclerotic calcifications in the abdominal
aorta without aneurysm. Few prominent lymph nodes near the distal
external iliac nodal chain are nonspecific and could be reactive.
Again noted are multiple small lymph nodes in the periaortic region.

Reproductive: Again noted is a fat attenuating lesion in the right
adnexa. This lesion measures 4.2 x 3.3 cm on sequence 2, image 60.
Findings are compatible with an ovarian dermoid tumor. Uterus and
left adnexa are unremarkable.

Other: Small inguinal hernias containing fat. Periumbilical hernia
with inflammation. This periumbilical hernia contains fat. Small
amount of inflammation along the anterior abdominal cavity adjacent
to the hernia. There is no bowel within this ventral hernia.
Negative for free fluid. Negative for free air.

Musculoskeletal: No acute bone abnormality.
IMPRESSION: 1. Periumbilical hernia with inflammatory changes. This
periumbilical hernia contains fat. No bowel involvement.
2. **An incidental finding of potential clinical significance has
been found. Slightly heterogeneous 1.9 cm low-density structure in
the right kidney. Hounsfield units are borderline for a cyst
measuring around 23. Cannot exclude a small internal septation or
enhancement within this structure. Findings likely represent a
complex cyst but indeterminate. Recommend further characterization
with MRI, with and without contrast.**
3. Low-attenuation of the liver and findings are compatible with
steatosis.
4. Right ovarian dermoid tumor. This dermoid tumor was present on
the exam from [DATE]. Punctate nodular density at the left lung base. This is likely an
incidental finding in a patient of this age. No follow-up needed if
patient is low-risk. Non-contrast chest CT can be considered in 12
months if patient is high-risk. This recommendation follows the
consensus statement: Guidelines for Management of Incidental
Pulmonary Nodules Detected on CT Images: From the [HOSPITAL]

## 2021-05-26 ENCOUNTER — Telehealth: Payer: Self-pay | Admitting: Oncology

## 2021-05-26 NOTE — Telephone Encounter (Signed)
Patient referred by Dr Gilford Rile for Iron Def/Anemia. Appt made for 06/08/21 Labs 2:45 pm - Consult 3:15 pm

## 2021-06-07 NOTE — Progress Notes (Signed)
[facility]  [address] [place],  [place]  [address] [phone]  Clinic Day:  06/08/2021  Referring physician: Bess Harvest*   HISTORY OF PRESENT ILLNESS:  The patient is a 40 y.o. female who I have seen in the past for iron deficiency anemia.   Previous IV iron was effective in replenishing her iron stores and normalizing her hemoglobin.  She comes back in today as recent labs showed a low hemoglobin of 9.0, with a low MCV of 75.8.  Her iron parameters included a low ferritin 6, a low serum iron of 28, an elevated TIBC of 490 and a low iron saturation of 6%.  She claims her menstrual cycles have been erratic.  When they occur, she sees clots within them.  She denies having other overt forms of blood loss.  She has not seen gynecology recently.   She previously took oral iron, but can no longer do it as is caused GI upset.   PHYSICAL EXAM:  Blood pressure (!) 177/90, pulse 76, temperature 98 F (36.7 C), temperature source Oral, resp. rate 16, height '5\' 2"'$  (1.575 m), weight 213 lb 12.8 oz (97 kg), SpO2 98 %. Wt Readings from Last 3 Encounters:  06/08/21 213 lb 12.8 oz (97 kg)  09/10/18 270 lb (122.5 kg)  01/23/15 262 lb 6 oz (119 kg)   Body mass index is 39.1 kg/m. Performance status (ECOG): 1 - Symptomatic but completely ambulatory Physical Exam Constitutional:      Appearance: Normal appearance. She is not ill-appearing.  HENT:     Mouth/Throat:     Mouth: Mucous membranes are moist.     Pharynx: Oropharynx is clear. No oropharyngeal exudate or posterior oropharyngeal erythema.  Cardiovascular:     Rate and Rhythm: Normal rate and regular rhythm.     Heart sounds: No murmur heard.   No friction rub. No gallop.  Pulmonary:     Effort: Pulmonary effort is normal. No respiratory distress.     Breath sounds: Normal breath sounds. No wheezing, rhonchi or rales.  Abdominal:     General: Bowel sounds are normal. There is no  distension.     Palpations: Abdomen is soft. There is no mass.     Tenderness: There is no abdominal tenderness.  Musculoskeletal:        General: No swelling.     Right lower leg: No edema.     Left lower leg: No edema.  Lymphadenopathy:     Cervical: No cervical adenopathy.     Upper Body:     Right upper body: No supraclavicular or axillary adenopathy.     Left upper body: No supraclavicular or axillary adenopathy.     Lower Body: No right inguinal adenopathy. No left inguinal adenopathy.  Skin:    General: Skin is warm.     Coloration: Skin is not jaundiced.     Findings: No lesion or rash.  Neurological:     General: No focal deficit present.     Mental Status: She is alert and oriented to person, place, and time. Mental status is at baseline.     Cranial Nerves: Cranial nerves are intact.  Psychiatric:        Mood and Affect: Mood normal.        Behavior: Behavior normal.        Thought Content: Thought content normal.   LABS:   ASSESSMENT & PLAN:  A 40 y.o. female with recurrent iron deficiency  anemia related to her heavy menstrual cycles.  I will arrange for her to receive another course of IV iron over these next few weeks to replenish her iron stores and normalize her hemoglobin.  I have also encouraged her to get re-evaluated by her gynecologist in [place] to ensure there is not ominous pathology behind her irregular menstrual cycles.  I will see her back in 3 months to reassess her iron and hemoglobin levels to see how well she responded to her upcoming IV iron.  The patient understands all the plans discussed today and is in agreement with them.   Terrick Allred Macarthur Critchley, MD

## 2021-06-08 ENCOUNTER — Encounter: Payer: Self-pay | Admitting: Oncology

## 2021-06-08 ENCOUNTER — Other Ambulatory Visit: Payer: Self-pay | Admitting: Oncology

## 2021-06-08 ENCOUNTER — Inpatient Hospital Stay: Payer: Medicaid Other | Attending: Oncology

## 2021-06-08 ENCOUNTER — Telehealth: Payer: Self-pay | Admitting: Oncology

## 2021-06-08 ENCOUNTER — Other Ambulatory Visit: Payer: Self-pay

## 2021-06-08 ENCOUNTER — Inpatient Hospital Stay (INDEPENDENT_AMBULATORY_CARE_PROVIDER_SITE_OTHER): Payer: Medicaid Other | Admitting: Oncology

## 2021-06-08 VITALS — BP 177/90 | HR 76 | Temp 98.0°F | Resp 16 | Ht 62.0 in | Wt 213.8 lb

## 2021-06-08 DIAGNOSIS — D500 Iron deficiency anemia secondary to blood loss (chronic): Secondary | ICD-10-CM

## 2021-06-08 LAB — CBC AND DIFFERENTIAL
HCT: 28 — AB (ref 36–46)
Hemoglobin: 8.8 — AB (ref 12.0–16.0)
Neutrophils Absolute: 4.03
Platelets: 252 (ref 150–399)
WBC: 6.1

## 2021-06-08 LAB — CBC: RBC: 3.58 — AB (ref 3.87–5.11)

## 2021-06-08 NOTE — Telephone Encounter (Signed)
Per 8/15 LOS next appt scheduled and given to patient

## 2021-08-07 ENCOUNTER — Telehealth: Payer: Self-pay | Admitting: Oncology

## 2021-08-07 ENCOUNTER — Encounter: Payer: Self-pay | Admitting: Oncology

## 2021-08-07 NOTE — Telephone Encounter (Signed)
Per 10/14 Staff Msg, patient scheduled for Venofer 10/19, 10/26, 11/2 - Rescheduled Labs, Follow Up to Nov 02, 2021.  Patient Notified - Mailing Appt Summary

## 2021-08-10 ENCOUNTER — Other Ambulatory Visit: Payer: Self-pay | Admitting: Pharmacist

## 2021-08-11 MED FILL — Iron Sucrose Inj 20 MG/ML (Fe Equiv): INTRAVENOUS | Qty: 15 | Status: AC

## 2021-08-12 ENCOUNTER — Inpatient Hospital Stay: Payer: Medicaid Other | Attending: Oncology

## 2021-08-12 ENCOUNTER — Other Ambulatory Visit: Payer: Self-pay

## 2021-08-12 VITALS — BP 189/108 | HR 87 | Temp 97.8°F | Resp 16 | Ht 62.0 in | Wt 250.0 lb

## 2021-08-12 DIAGNOSIS — D500 Iron deficiency anemia secondary to blood loss (chronic): Secondary | ICD-10-CM | POA: Diagnosis present

## 2021-08-12 DIAGNOSIS — R519 Headache, unspecified: Secondary | ICD-10-CM | POA: Diagnosis not present

## 2021-08-12 DIAGNOSIS — N920 Excessive and frequent menstruation with regular cycle: Secondary | ICD-10-CM | POA: Diagnosis present

## 2021-08-12 MED ORDER — SODIUM CHLORIDE 0.9 % IV SOLN
300.0000 mg | Freq: Once | INTRAVENOUS | Status: DC
  Filled 2021-08-12: qty 15

## 2021-08-12 MED ORDER — SODIUM CHLORIDE 0.9 % IV SOLN: Freq: Once | INTRAVENOUS | Status: DC

## 2021-08-12 MED ORDER — HEPARIN SOD (PORK) LOCK FLUSH 100 UNIT/ML IV SOLN: 250.0000 [IU] | Freq: Once | INTRAVENOUS | Status: DC | PRN

## 2021-08-12 MED ORDER — SODIUM CHLORIDE 0.9% FLUSH: 10.0000 mL | Freq: Once | INTRAVENOUS | Status: DC | PRN

## 2021-08-12 MED ORDER — ALTEPLASE 2 MG IJ SOLR: 2.0000 mg | Freq: Once | INTRAMUSCULAR | Status: DC | PRN

## 2021-08-12 MED ORDER — CLONIDINE HCL 0.1 MG PO TABS
0.1000 mg | ORAL_TABLET | Freq: Once | ORAL | Status: AC
  Administered 2021-08-12: 0.1 mg via ORAL
  Filled 2021-08-12: qty 1

## 2021-08-12 MED ORDER — HEPARIN SOD (PORK) LOCK FLUSH 100 UNIT/ML IV SOLN: 500.0000 [IU] | Freq: Once | INTRAVENOUS | Status: DC | PRN

## 2021-08-12 MED ORDER — SODIUM CHLORIDE 0.9% FLUSH: 3.0000 mL | Freq: Once | INTRAVENOUS | Status: DC | PRN

## 2021-08-12 NOTE — Progress Notes (Signed)
1515- Primary Care Provider's RN Rayetta Humphrey notified of patient's BP. States she will inform primary care provider and will have them follow up with patient by phone. Patient was instructed to go to ED for any continued headache, facial numbness or other symptoms as advised per primary care RN. Patient states understanding- NP and pharmacist aware of situation. Patient discharged home.

## 2021-08-12 NOTE — Addendum Note (Signed)
Addended by: Maxwell Marion on: 08/12/2021 03:11 PM   Modules accepted: Orders

## 2021-08-12 NOTE — Progress Notes (Signed)
33- Notified pharmacist and NP of BP. Orders received.

## 2021-08-12 NOTE — Patient Instructions (Signed)

## 2021-08-17 ENCOUNTER — Telehealth: Payer: Self-pay

## 2021-08-17 NOTE — Telephone Encounter (Addendum)
08/18/21 - Tarun Patchell,RN: I spoke with pt and told her that Dr Bobby Rumpf recommended that we move her infusions a couple weeks out. She was definitely agreeable. I told her the schedulers would call her to set up appt. She states after she delivered the baby, her BP came down some. She is taking her BP med also. She doesn't have a way to check her BP @ home. I offered her our condolences in the loss of her baby.   08/17/21 - Dr Bobby Rumpf: let this play out.....give her some time to grieve before bringing the iron infusion back up (2 weeks at least)   08/17/21 - Ulice Dash, pharmacist: That is horrible!  We didn't give her the venofer last week because her BP was too high.  Has she gotten that under control yet?     08/17/21 @1442  -Evalisse Prajapati,RN: Pt called. She found out she was pregnant and had a miscarriage over the weekend. She wants to know if it is still okay to come & get her infusion on Wednesday?

## 2021-08-18 ENCOUNTER — Encounter: Payer: Self-pay | Admitting: Oncology

## 2021-08-19 ENCOUNTER — Ambulatory Visit: Payer: Medicaid Other

## 2021-08-21 ENCOUNTER — Other Ambulatory Visit: Payer: Self-pay | Admitting: Pharmacist

## 2021-08-25 MED FILL — Iron Sucrose Inj 20 MG/ML (Fe Equiv): INTRAVENOUS | Qty: 15 | Status: AC

## 2021-08-26 ENCOUNTER — Other Ambulatory Visit: Payer: Self-pay | Admitting: Hematology and Oncology

## 2021-08-26 ENCOUNTER — Ambulatory Visit: Payer: Medicaid Other

## 2021-08-26 ENCOUNTER — Inpatient Hospital Stay: Payer: Medicaid Other

## 2021-08-26 ENCOUNTER — Telehealth: Payer: Self-pay

## 2021-08-26 DIAGNOSIS — D500 Iron deficiency anemia secondary to blood loss (chronic): Secondary | ICD-10-CM

## 2021-08-26 NOTE — Telephone Encounter (Signed)
Called patient she missed her Venofer infusion today.. She reported to me that she has miscarried and her OB/GYN has told her she can not have this rescheduled for at least 4 weeks d/t labile hypertension uncontrolled. They will release her to reschedule when stable.

## 2021-09-02 ENCOUNTER — Ambulatory Visit: Payer: Medicaid Other

## 2021-09-04 DIAGNOSIS — I361 Nonrheumatic tricuspid (valve) insufficiency: Secondary | ICD-10-CM

## 2021-09-04 DIAGNOSIS — I351 Nonrheumatic aortic (valve) insufficiency: Secondary | ICD-10-CM

## 2021-09-08 ENCOUNTER — Other Ambulatory Visit: Payer: Medicaid Other

## 2021-09-08 ENCOUNTER — Ambulatory Visit: Payer: Medicaid Other | Admitting: Oncology

## 2021-09-09 ENCOUNTER — Ambulatory Visit: Payer: Medicaid Other

## 2021-10-29 ENCOUNTER — Other Ambulatory Visit: Payer: Self-pay | Admitting: Pharmacist

## 2021-10-29 NOTE — Progress Notes (Incomplete)
[facility]  [address] [place],  [place]  [address] [phone]  Clinic Day:  10/29/2021  Referring physician: Bess Harvest*  This document serves as a record of services personally performed by Marice Potter, MD. It was created on their behalf by Curry,Lauren E, a trained medical scribe. The creation of this record is based on the scribe's personal observations and the provider's statements to them.  HISTORY OF PRESENT ILLNESS:  The patient is a 41 y.o. female who I have seen in the past for iron deficiency anemia.   Previous IV iron was effective in replenishing her iron stores and normalizing her hemoglobin.  She comes back in today to reassess her hemoglobin and iron levels after recently receiving IV iron.  She claims her menstrual cycles have been erratic.  When they occur, she sees clots within them.  She denies having other overt forms of blood loss.  She has not seen gynecology recently.   She previously took oral iron, but can no longer do it as is caused GI upset.   PHYSICAL EXAM:  There were no vitals taken for this visit. Wt Readings from Last 3 Encounters:  08/12/21 250 lb 0.6 oz (113.4 kg)  06/08/21 213 lb 12.8 oz (97 kg)  09/10/18 270 lb (122.5 kg)   There is no height or weight on file to calculate BMI. Performance status (ECOG): 1 - Symptomatic but completely ambulatory Physical Exam Constitutional:      Appearance: Normal appearance. She is not ill-appearing.  HENT:     Mouth/Throat:     Mouth: Mucous membranes are moist.     Pharynx: Oropharynx is clear. No oropharyngeal exudate or posterior oropharyngeal erythema.  Cardiovascular:     Rate and Rhythm: Normal rate and regular rhythm.     Heart sounds: No murmur heard.   No friction rub. No gallop.  Pulmonary:     Effort: Pulmonary effort is normal. No respiratory distress.     Breath sounds: Normal breath sounds. No wheezing, rhonchi or rales.  Abdominal:      General: Bowel sounds are normal. There is no distension.     Palpations: Abdomen is soft. There is no mass.     Tenderness: There is no abdominal tenderness.  Musculoskeletal:        General: No swelling.     Right lower leg: No edema.     Left lower leg: No edema.  Lymphadenopathy:     Cervical: No cervical adenopathy.     Upper Body:     Right upper body: No supraclavicular or axillary adenopathy.     Left upper body: No supraclavicular or axillary adenopathy.     Lower Body: No right inguinal adenopathy. No left inguinal adenopathy.  Skin:    General: Skin is warm.     Coloration: Skin is not jaundiced.     Findings: No lesion or rash.  Neurological:     General: No focal deficit present.     Mental Status: She is alert and oriented to person, place, and time. Mental status is at baseline.  Psychiatric:        Mood and Affect: Mood normal.        Behavior: Behavior normal.        Thought Content: Thought content normal.   LABS:   ASSESSMENT & PLAN:  A 41 y.o. female with recurrent iron deficiency anemia related to her heavy menstrual cycles.  I will arrange for her  to receive another course of IV iron over these next few weeks to replenish her iron stores and normalize her hemoglobin.  I have also encouraged her to get re-evaluated by her gynecologist in [place] to ensure there is not ominous pathology behind her irregular menstrual cycles.  I will see her back in 3 months to reassess her iron and hemoglobin levels to see how well she responded to her upcoming IV iron.  The patient understands all the plans discussed today and is in agreement with them.  I, Rita Ohara, am acting as scribe for Marice Potter, MD    I have reviewed this report as typed by the medical scribe, and it is complete and accurate.  Dequincy Macarthur Critchley, MD

## 2021-11-02 ENCOUNTER — Ambulatory Visit: Payer: Medicaid Other | Admitting: Oncology

## 2021-11-02 ENCOUNTER — Other Ambulatory Visit: Payer: Medicaid Other

## 2022-01-07 ENCOUNTER — Other Ambulatory Visit: Payer: Self-pay | Admitting: Oncology

## 2022-01-07 NOTE — Progress Notes (Signed)
?[facility]  ?[address] ?[place],  [place]  [address] ?[phone] ? ?Clinic Day:  01/08/2022 ? ?Referring physician: Bess Harvest* ? ? ?HISTORY OF PRESENT ILLNESS:  ?The patient is a 41 y.o. female who I have seen in the past for iron deficiency anemia secondary to her heavy, irregular menstrual cycles..  She comes back in today to reassess her iron and hemoglobin levels.  Previous IV iron has been necessary to replenish her iron stores and normalize her hemoglobin.  She does complain of still feeling weak today.  Her menstrual cycles remain heavy.  She denies having other overt forms of blood loss. ? ?PHYSICAL EXAM:  ?Blood pressure (!) 165/91, pulse 80, temperature 98.2 ?F (36.8 ?C), resp. rate 16, height '5\' 2"'$  (1.575 m), weight 219 lb 9.6 oz (99.6 kg), SpO2 99 %. ?Wt Readings from Last 3 Encounters:  ?01/08/22 219 lb 9.6 oz (99.6 kg)  ?08/12/21 250 lb 0.6 oz (113.4 kg)  ?06/08/21 213 lb 12.8 oz (97 kg)  ? ?Body mass index is 40.17 kg/m?Marland Kitchen ?Performance status (ECOG): 1 - Symptomatic but completely ambulatory ?Physical Exam ?Constitutional:   ?   Appearance: Normal appearance. She is not ill-appearing.  ?HENT:  ?   Mouth/Throat:  ?   Mouth: Mucous membranes are moist.  ?   Pharynx: Oropharynx is clear. No oropharyngeal exudate or posterior oropharyngeal erythema.  ?Cardiovascular:  ?   Rate and Rhythm: Normal rate and regular rhythm.  ?   Heart sounds: No murmur heard. ?  No friction rub. No gallop.  ?Pulmonary:  ?   Effort: Pulmonary effort is normal. No respiratory distress.  ?   Breath sounds: Normal breath sounds. No wheezing, rhonchi or rales.  ?Abdominal:  ?   General: Bowel sounds are normal. There is no distension.  ?   Palpations: Abdomen is soft. There is no mass.  ?   Tenderness: There is no abdominal tenderness.  ?Musculoskeletal:     ?   General: No swelling.  ?   Right lower leg: No edema.  ?   Left lower leg: No edema.  ?Lymphadenopathy:  ?    Cervical: No cervical adenopathy.  ?   Upper Body:  ?   Right upper body: No supraclavicular or axillary adenopathy.  ?   Left upper body: No supraclavicular or axillary adenopathy.  ?   Lower Body: No right inguinal adenopathy. No left inguinal adenopathy.  ?Skin: ?   General: Skin is warm.  ?   Coloration: Skin is not jaundiced.  ?   Findings: No lesion or rash.  ?Neurological:  ?   General: No focal deficit present.  ?   Mental Status: She is alert and oriented to person, place, and time. Mental status is at baseline.  ?Psychiatric:     ?   Mood and Affect: Mood normal.     ?   Behavior: Behavior normal.     ?   Thought Content: Thought content normal.  ? ?LABS:  ? ? Latest Reference Range & Units 01/08/22 14:12  ?Iron 28 - 170 ug/dL 29  ?UIBC ug/dL 399  ?TIBC 250 - 450 ug/dL 428  ?Saturation Ratios 10.4 - 31.8 % 7 (L)  ?Ferritin 11 - 307 ng/mL 5 (L)  ?(L): Data is abnormally low ? ?ASSESSMENT & PLAN:  ?A 41 y.o. female whose labs today clearly show recurrent iron deficiency anemia likely related to her heavy menstrual cycles.  I will arrange for her  to receive another course of IV iron over these next few weeks to replenish her iron stores and normalize her hemoglobin.  I have also encouraged her to get evaluated by her gynecologist in [place] to determine how to better normalize her menstrual cycles to where they will not need to recurrent iron deficiency anemia.  Otherwise, I will see her back in 3 months to reassess her iron and hemoglobin levels to see how well she responded to her upcoming IV iron.  The patient understands all the plans discussed today and is in agreement with them. ? ? ?Ryanne Morand Macarthur Critchley, MD   ? ? ?  ? ?

## 2022-01-08 ENCOUNTER — Inpatient Hospital Stay: Payer: Medicaid Other

## 2022-01-08 ENCOUNTER — Inpatient Hospital Stay: Payer: Medicaid Other | Attending: Oncology | Admitting: Oncology

## 2022-01-08 VITALS — BP 165/91 | HR 80 | Temp 98.2°F | Resp 16 | Ht 62.0 in | Wt 219.6 lb

## 2022-01-08 DIAGNOSIS — D509 Iron deficiency anemia, unspecified: Secondary | ICD-10-CM | POA: Diagnosis present

## 2022-01-08 DIAGNOSIS — D500 Iron deficiency anemia secondary to blood loss (chronic): Secondary | ICD-10-CM

## 2022-01-08 LAB — CBC: RBC: 3.95 (ref 3.87–5.11)

## 2022-01-08 LAB — IRON AND TIBC
Iron: 29 ug/dL (ref 28–170)
Saturation Ratios: 7 % — ABNORMAL LOW (ref 10.4–31.8)
TIBC: 428 ug/dL (ref 250–450)
UIBC: 399 ug/dL

## 2022-01-08 LAB — CBC AND DIFFERENTIAL
HCT: 30 — AB (ref 36–46)
Hemoglobin: 9.4 — AB (ref 12.0–16.0)
Neutrophils Absolute: 2.92
Platelets: 332 10*3/uL (ref 150–400)
WBC: 5.3

## 2022-01-09 LAB — FERRITIN: Ferritin: 5 ng/mL — ABNORMAL LOW (ref 11–307)

## 2022-01-17 ENCOUNTER — Encounter: Payer: Self-pay | Admitting: Oncology

## 2022-03-11 ENCOUNTER — Encounter: Payer: Self-pay | Admitting: Oncology

## 2022-03-16 ENCOUNTER — Inpatient Hospital Stay: Payer: Medicaid Other | Attending: Oncology

## 2022-03-16 ENCOUNTER — Other Ambulatory Visit: Payer: Self-pay | Admitting: Pharmacist

## 2022-03-16 VITALS — BP 120/68 | HR 72 | Temp 98.1°F | Resp 18 | Ht 62.0 in | Wt 223.1 lb

## 2022-03-16 DIAGNOSIS — D500 Iron deficiency anemia secondary to blood loss (chronic): Secondary | ICD-10-CM | POA: Diagnosis not present

## 2022-03-16 MED ORDER — FAMOTIDINE IN NACL 20-0.9 MG/50ML-% IV SOLN
20.0000 mg | Freq: Once | INTRAVENOUS | Status: AC
  Administered 2022-03-16: 20 mg via INTRAVENOUS
  Filled 2022-03-16: qty 50

## 2022-03-16 MED ORDER — SODIUM CHLORIDE 0.9 % IV SOLN: Freq: Once | INTRAVENOUS | Status: AC

## 2022-03-16 MED ORDER — SODIUM CHLORIDE 0.9 % IV SOLN
510.0000 mg | Freq: Once | INTRAVENOUS | Status: AC
  Administered 2022-03-16: 510 mg via INTRAVENOUS
  Filled 2022-03-16: qty 510

## 2022-03-16 MED ORDER — ACETAMINOPHEN 325 MG PO TABS
650.0000 mg | ORAL_TABLET | Freq: Once | ORAL | Status: AC
  Administered 2022-03-16: 650 mg via ORAL
  Filled 2022-03-16: qty 2

## 2022-03-16 NOTE — Patient Instructions (Signed)

## 2022-03-18 MED FILL — Ferumoxytol Inj 510 MG/17ML (30 MG/ML) (Elemental Fe): INTRAVENOUS | Qty: 17 | Status: AC

## 2022-03-19 ENCOUNTER — Inpatient Hospital Stay: Payer: Medicaid Other

## 2022-03-19 VITALS — BP 115/78 | HR 67

## 2022-03-19 DIAGNOSIS — D500 Iron deficiency anemia secondary to blood loss (chronic): Secondary | ICD-10-CM

## 2022-03-19 MED ORDER — ACETAMINOPHEN 325 MG PO TABS
650.0000 mg | ORAL_TABLET | Freq: Once | ORAL | Status: AC
  Administered 2022-03-19: 650 mg via ORAL
  Filled 2022-03-19: qty 2

## 2022-03-19 MED ORDER — SODIUM CHLORIDE 0.9 % IV SOLN: Freq: Once | INTRAVENOUS | Status: AC

## 2022-03-19 MED ORDER — SODIUM CHLORIDE 0.9 % IV SOLN
510.0000 mg | Freq: Once | INTRAVENOUS | Status: AC
  Administered 2022-03-19: 510 mg via INTRAVENOUS
  Filled 2022-03-19: qty 510

## 2022-03-19 MED ORDER — FAMOTIDINE IN NACL 20-0.9 MG/50ML-% IV SOLN
20.0000 mg | Freq: Once | INTRAVENOUS | Status: AC
  Administered 2022-03-19: 20 mg via INTRAVENOUS
  Filled 2022-03-19: qty 50

## 2022-03-19 NOTE — Patient Instructions (Signed)

## 2022-04-09 ENCOUNTER — Ambulatory Visit: Payer: Medicaid Other | Admitting: Oncology

## 2022-04-09 ENCOUNTER — Other Ambulatory Visit: Payer: Medicaid Other

## 2022-06-18 ENCOUNTER — Inpatient Hospital Stay: Payer: Medicaid Other | Admitting: Oncology

## 2022-06-18 ENCOUNTER — Other Ambulatory Visit: Payer: Self-pay | Admitting: Oncology

## 2022-06-18 ENCOUNTER — Inpatient Hospital Stay: Payer: Medicaid Other | Attending: Oncology

## 2022-06-18 VITALS — BP 141/96 | HR 69 | Temp 98.6°F | Resp 16 | Ht 62.0 in | Wt 226.5 lb

## 2022-06-18 DIAGNOSIS — D500 Iron deficiency anemia secondary to blood loss (chronic): Secondary | ICD-10-CM | POA: Diagnosis present

## 2022-06-18 LAB — CBC AND DIFFERENTIAL
HCT: 38 (ref 36–46)
Hemoglobin: 12.9 (ref 12.0–16.0)
Neutrophils Absolute: 3.78
Platelets: 267 10*3/uL (ref 150–400)
WBC: 7

## 2022-06-18 LAB — IRON AND TIBC
Iron: 83 ug/dL (ref 28–170)
Saturation Ratios: 22 % (ref 10.4–31.8)
TIBC: 377 ug/dL (ref 250–450)
UIBC: 294 ug/dL

## 2022-06-18 LAB — FERRITIN: Ferritin: 21 ng/mL (ref 11–307)

## 2022-06-18 LAB — CBC

## 2022-06-18 NOTE — Progress Notes (Incomplete)
[facility]  [address] [place],  [place]  [address] [phone]  Clinic Day:  01/08/2022  Referring physician: No ref. provider found   HISTORY OF PRESENT ILLNESS:  The patient is a 41 y.o. female who I have seen in the past for iron deficiency anemia secondary to her heavy, irregular menstrual cycles..  She comes back in today to reassess her iron and hemoglobin levels.  Previous IV iron has been necessary to replenish her iron stores and normalize her hemoglobin.  She does complain of still feeling weak today.  Her menstrual cycles remain heavy.  She denies having other overt forms of blood loss.  PHYSICAL EXAM:  There were no vitals taken for this visit. Wt Readings from Last 3 Encounters:  06/18/22 226 lb 8 oz (102.7 kg)  03/16/22 223 lb 1.3 oz (101.2 kg)  01/08/22 219 lb 9.6 oz (99.6 kg)   There is no height or weight on file to calculate BMI. Performance status (ECOG): 1 - Symptomatic but completely ambulatory Physical Exam Constitutional:      Appearance: Normal appearance. She is not ill-appearing.  HENT:     Mouth/Throat:     Mouth: Mucous membranes are moist.     Pharynx: Oropharynx is clear. No oropharyngeal exudate or posterior oropharyngeal erythema.  Cardiovascular:     Rate and Rhythm: Normal rate and regular rhythm.     Heart sounds: No murmur heard.    No friction rub. No gallop.  Pulmonary:     Effort: Pulmonary effort is normal. No respiratory distress.     Breath sounds: Normal breath sounds. No wheezing, rhonchi or rales.  Abdominal:     General: Bowel sounds are normal. There is no distension.     Palpations: Abdomen is soft. There is no mass.     Tenderness: There is no abdominal tenderness.  Musculoskeletal:        General: No swelling.     Right lower leg: No edema.     Left lower leg: No edema.  Lymphadenopathy:     Cervical: No cervical adenopathy.     Upper Body:     Right upper body: No supraclavicular  or axillary adenopathy.     Left upper body: No supraclavicular or axillary adenopathy.     Lower Body: No right inguinal adenopathy. No left inguinal adenopathy.  Skin:    General: Skin is warm.     Coloration: Skin is not jaundiced.     Findings: No lesion or rash.  Neurological:     General: No focal deficit present.     Mental Status: She is alert and oriented to person, place, and time. Mental status is at baseline.  Psychiatric:        Mood and Affect: Mood normal.        Behavior: Behavior normal.        Thought Content: Thought content normal.   LABS:    Latest Reference Range & Units 01/08/22 14:12  Iron 28 - 170 ug/dL 29  UIBC ug/dL 399  TIBC 250 - 450 ug/dL 428  Saturation Ratios 10.4 - 31.8 % 7 (L)  Ferritin 11 - 307 ng/mL 5 (L)  (L): Data is abnormally low  ASSESSMENT & PLAN:  A 41 y.o. female whose labs today clearly show recurrent iron deficiency anemia likely related to her heavy menstrual cycles.  I will arrange for her to receive another course of IV iron over these next few weeks to replenish  her iron stores and normalize her hemoglobin.  I have also encouraged her to get evaluated by her gynecologist in [place] to determine how to better normalize her menstrual cycles to where they will not need to recurrent iron deficiency anemia.  Otherwise, I will see her back in 3 months to reassess her iron and hemoglobin levels to see how well she responded to her upcoming IV iron.  The patient understands all the plans discussed today and is in agreement with them.   Shaniquia Brafford Macarthur Critchley, MD

## 2022-06-20 ENCOUNTER — Encounter: Payer: Self-pay | Admitting: Oncology

## 2022-06-20 NOTE — Progress Notes (Signed)
[facility]  [address] [place],  [place]  [address] [phone]  Clinic Day:  06/18/2022  Referring physician: Bess Harvest*   HISTORY OF PRESENT ILLNESS:  The patient is a 41 y.o. female who I have seen in the past for iron deficiency anemia secondary to her heavy, irregular menstrual cycles..  She comes back in today to reassess her iron and hemoglobin levels after receiving another course of IV iron.  Since her last visit, the patient has been doing well.  She currently has an IUD in place, which have significantly curtailed the heaviness and frequency of her menstrual cycles.  She continues to deny having other overt forms of blood loss which concern her for recurrent iron deficiency anemia  PHYSICAL EXAM:  Blood pressure (!) 141/96, pulse 69, temperature 98.6 F (37 C), resp. rate 16, height '5\' 2"'$  (1.575 m), weight 226 lb 8 oz (102.7 kg), SpO2 97 %. Wt Readings from Last 3 Encounters:  06/18/22 226 lb 8 oz (102.7 kg)  03/16/22 223 lb 1.3 oz (101.2 kg)  01/08/22 219 lb 9.6 oz (99.6 kg)   Body mass index is 41.43 kg/m. Performance status (ECOG): 1 - Symptomatic but completely ambulatory Physical Exam Constitutional:      Appearance: Normal appearance. She is not ill-appearing.  HENT:     Mouth/Throat:     Mouth: Mucous membranes are moist.     Pharynx: Oropharynx is clear. No oropharyngeal exudate or posterior oropharyngeal erythema.  Cardiovascular:     Rate and Rhythm: Normal rate and regular rhythm.     Heart sounds: No murmur heard.    No friction rub. No gallop.  Pulmonary:     Effort: Pulmonary effort is normal. No respiratory distress.     Breath sounds: Normal breath sounds. No wheezing, rhonchi or rales.  Abdominal:     General: Bowel sounds are normal. There is no distension.     Palpations: Abdomen is soft. There is no mass.     Tenderness: There is no abdominal tenderness.  Musculoskeletal:        General: No  swelling.     Right lower leg: No edema.     Left lower leg: No edema.  Lymphadenopathy:     Cervical: No cervical adenopathy.     Upper Body:     Right upper body: No supraclavicular or axillary adenopathy.     Left upper body: No supraclavicular or axillary adenopathy.     Lower Body: No right inguinal adenopathy. No left inguinal adenopathy.  Skin:    General: Skin is warm.     Coloration: Skin is not jaundiced.     Findings: No lesion or rash.  Neurological:     General: No focal deficit present.     Mental Status: She is alert and oriented to person, place, and time. Mental status is at baseline.  Psychiatric:        Mood and Affect: Mood normal.        Behavior: Behavior normal.        Thought Content: Thought content normal.    LABS:    Latest Reference Range & Units 06/18/22 00:00 06/18/22 11:17  Iron 28 - 170 ug/dL  83  UIBC ug/dL  294  TIBC 250 - 450 ug/dL  377  Saturation Ratios 10.4 - 31.8 %  22  Ferritin 11 - 307 ng/mL  21  WBC  7.0 (E)   RBC 3.87 - 5.11  4.29 (  E)   Hemoglobin 12.0 - 16.0  12.9 (E)   HCT 36 - 46  38 (E)   Platelets 150 - 400 K/uL 267 (E)   (E): External lab result  ASSESSMENT & PLAN:  A 41 y.o. female with iron deficiency anemia secondary to her heavy menstrual cycles.  I am very pleased with her iron and hemoglobin levels today, which reflect improvement from her recent IV iron.  Furthermore, her IUD appears to be controlling the severity of her menstrual cycles, which also will help prevent recurrent iron deficiency anemia.  Clinically, the patient appears to be doing well.  I will see her back in 6 months for repeat clinical assessment.  The patient understands all the plans discussed today and is in agreement with them.   Jd Mccaster Macarthur Critchley, MD

## 2022-12-19 NOTE — Progress Notes (Unsigned)
[facility]  [address] [place],  [place]  [address] [phone]  Clinic Day:  06/18/2022  Referring physician: Bess Harvest*   HISTORY OF PRESENT ILLNESS:  The patient is a 42 y.o. female who I have seen in the past for iron deficiency anemia secondary to her heavy, irregular menstrual cycles.  In the past, IV iron was very effective in replenishing her iron stores and improving her hemoglobin.  She currently has an IUD in place, which have significantly curtailed the heaviness and frequency of her menstrual cycles.  She continues to deny having other overt forms of blood loss which concern her for recurrent iron deficiency anemia  PHYSICAL EXAM:  There were no vitals taken for this visit. Wt Readings from Last 3 Encounters:  06/18/22 226 lb 8 oz (102.7 kg)  03/16/22 223 lb 1.3 oz (101.2 kg)  01/08/22 219 lb 9.6 oz (99.6 kg)   There is no height or weight on file to calculate BMI. Performance status (ECOG): 1 - Symptomatic but completely ambulatory Physical Exam Constitutional:      Appearance: Normal appearance. She is not ill-appearing.  HENT:     Mouth/Throat:     Mouth: Mucous membranes are moist.     Pharynx: Oropharynx is clear. No oropharyngeal exudate or posterior oropharyngeal erythema.  Cardiovascular:     Rate and Rhythm: Normal rate and regular rhythm.     Heart sounds: No murmur heard.    No friction rub. No gallop.  Pulmonary:     Effort: Pulmonary effort is normal. No respiratory distress.     Breath sounds: Normal breath sounds. No wheezing, rhonchi or rales.  Abdominal:     General: Bowel sounds are normal. There is no distension.     Palpations: Abdomen is soft. There is no mass.     Tenderness: There is no abdominal tenderness.  Musculoskeletal:        General: No swelling.     Right lower leg: No edema.     Left lower leg: No edema.  Lymphadenopathy:     Cervical: No cervical adenopathy.     Upper  Body:     Right upper body: No supraclavicular or axillary adenopathy.     Left upper body: No supraclavicular or axillary adenopathy.     Lower Body: No right inguinal adenopathy. No left inguinal adenopathy.  Skin:    General: Skin is warm.     Coloration: Skin is not jaundiced.     Findings: No lesion or rash.  Neurological:     General: No focal deficit present.     Mental Status: She is alert and oriented to person, place, and time. Mental status is at baseline.  Psychiatric:        Mood and Affect: Mood normal.        Behavior: Behavior normal.        Thought Content: Thought content normal.    LABS:    Latest Reference Range & Units 06/18/22 00:00 06/18/22 11:17  Iron 28 - 170 ug/dL  83  UIBC ug/dL  294  TIBC 250 - 450 ug/dL  377  Saturation Ratios 10.4 - 31.8 %  22  Ferritin 11 - 307 ng/mL  21  WBC  7.0 (E)   RBC 3.87 - 5.11  4.29 (E)   Hemoglobin 12.0 - 16.0  12.9 (E)   HCT 36 - 46  38 (E)   Platelets 150 - 400 K/uL 267 (E)   (  E): External lab result  ASSESSMENT & PLAN:  A 42 y.o. female with iron deficiency anemia secondary to her heavy menstrual cycles.  I am very pleased with her iron and hemoglobin levels today, which reflect improvement from her recent IV iron.  Furthermore, her IUD appears to be controlling the severity of her menstrual cycles, which also will help prevent recurrent iron deficiency anemia.  Clinically, the patient appears to be doing well.  I will see her back in 6 months for repeat clinical assessment.  The patient understands all the plans discussed today and is in agreement with them.   Nayab Aten Macarthur Critchley, MD

## 2022-12-20 ENCOUNTER — Inpatient Hospital Stay: Payer: Medicaid Other

## 2022-12-20 ENCOUNTER — Telehealth: Payer: Self-pay | Admitting: Oncology

## 2022-12-20 ENCOUNTER — Other Ambulatory Visit: Payer: Self-pay | Admitting: Oncology

## 2022-12-20 ENCOUNTER — Inpatient Hospital Stay: Payer: Medicaid Other | Attending: Oncology | Admitting: Oncology

## 2022-12-20 VITALS — BP 176/105 | HR 71 | Temp 99.1°F | Resp 16 | Ht 62.0 in | Wt 230.8 lb

## 2022-12-20 DIAGNOSIS — D500 Iron deficiency anemia secondary to blood loss (chronic): Secondary | ICD-10-CM | POA: Insufficient documentation

## 2022-12-20 DIAGNOSIS — N920 Excessive and frequent menstruation with regular cycle: Secondary | ICD-10-CM | POA: Insufficient documentation

## 2022-12-20 LAB — CBC AND DIFFERENTIAL
HCT: 38 (ref 36–46)
Hemoglobin: 12.8 (ref 12.0–16.0)
Neutrophils Absolute: 3.83
Platelets: 270 10*3/uL (ref 150–400)
WBC: 7.1

## 2022-12-20 LAB — IRON AND TIBC
Iron: 52 ug/dL (ref 28–170)
Saturation Ratios: 13 % (ref 10.4–31.8)
TIBC: 395 ug/dL (ref 250–450)
UIBC: 343 ug/dL

## 2022-12-20 LAB — CBC: RBC: 4.26 (ref 3.87–5.11)

## 2022-12-20 LAB — FERRITIN: Ferritin: 16 ng/mL (ref 11–307)

## 2022-12-20 NOTE — Telephone Encounter (Signed)
12/20/22 Next appt scheduled and confirmed with patient

## 2022-12-21 ENCOUNTER — Encounter: Payer: Self-pay | Admitting: Oncology

## 2023-06-19 NOTE — Progress Notes (Unsigned)
[facility]  [address] [place],  [place]  [address] [phone]  Clinic Day:  06/20/2023  Referring physician: Rhea Bleacher*   HISTORY OF PRESENT ILLNESS:  The patient is a 42 y.o. female who I have seen in the past for iron deficiency anemia secondary to her heavy, irregular menstrual cycles.  In the past, IV iron was very effective in replenishing her iron stores and improving her hemoglobin.  She currently has an IUD in place, which has significantly curtailed the heaviness and frequency of her menstrual cycles.  She comes in today for routine follow-up.  Since her last visit, the patient has been doing well.  She continues to deny having other overt forms of blood loss which concern her for recurrent iron deficiency anemia  PHYSICAL EXAM:  Blood pressure (!) 184/106, pulse 81, temperature 99.3 F (37.4 C), resp. rate 16, height 5\' 2"  (1.575 m), weight 230 lb 9.6 oz (104.6 kg), SpO2 98%. Wt Readings from Last 3 Encounters:  06/20/23 230 lb 9.6 oz (104.6 kg)  12/20/22 230 lb 12.8 oz (104.7 kg)  06/18/22 226 lb 8 oz (102.7 kg)   Body mass index is 42.18 kg/m. Performance status (ECOG): 1 - Symptomatic but completely ambulatory Physical Exam Constitutional:      Appearance: Normal appearance. She is not ill-appearing.  HENT:     Mouth/Throat:     Mouth: Mucous membranes are moist.     Pharynx: Oropharynx is clear. No oropharyngeal exudate or posterior oropharyngeal erythema.  Cardiovascular:     Rate and Rhythm: Normal rate and regular rhythm.     Heart sounds: No murmur heard.    No friction rub. No gallop.  Pulmonary:     Effort: Pulmonary effort is normal. No respiratory distress.     Breath sounds: Normal breath sounds. No wheezing, rhonchi or rales.  Abdominal:     General: Bowel sounds are normal. There is no distension.     Palpations: Abdomen is soft. There is no mass.     Tenderness: There is no abdominal tenderness.   Musculoskeletal:        General: No swelling.     Right lower leg: No edema.     Left lower leg: No edema.  Lymphadenopathy:     Cervical: No cervical adenopathy.     Upper Body:     Right upper body: No supraclavicular or axillary adenopathy.     Left upper body: No supraclavicular or axillary adenopathy.     Lower Body: No right inguinal adenopathy. No left inguinal adenopathy.  Skin:    General: Skin is warm.     Coloration: Skin is not jaundiced.     Findings: No lesion or rash.  Neurological:     General: No focal deficit present.     Mental Status: She is alert and oriented to person, place, and time. Mental status is at baseline.  Psychiatric:        Mood and Affect: Mood normal.        Behavior: Behavior normal.        Thought Content: Thought content normal.    LABS:     Latest Reference Range & Units 06/20/23 09:33  Iron 28 - 170 ug/dL 39  UIBC ug/dL 865  TIBC 784 - 696 ug/dL 295  Saturation Ratios 10.4 - 31.8 % 10 (L)  Ferritin 11 - 307 ng/mL 11  (L): Data is abnormally low  ASSESSMENT & PLAN:  A 42  y.o. female with iron deficiency anemia secondary to her heavy menstrual cycles.  I remain very pleased with her hemoglobin.  However, her iron levels have clearly been falling.  Based upon this, I will arrange for her to receive IV iron over these next few weeks to rapidly replenish her iron stores and further improve her hemoglobin.  I will see her back in 4 months to reassess her iron and hemoglobin levels to see how well she responded to her upcoming IV iron.  The patient understands all the plans discussed today and is in agreement with them.   Prithvi Kooi Kirby Funk, MD

## 2023-06-20 ENCOUNTER — Telehealth: Payer: Self-pay | Admitting: Oncology

## 2023-06-20 ENCOUNTER — Inpatient Hospital Stay: Payer: Medicaid Other | Attending: Oncology | Admitting: Oncology

## 2023-06-20 ENCOUNTER — Inpatient Hospital Stay: Payer: Medicaid Other

## 2023-06-20 ENCOUNTER — Other Ambulatory Visit: Payer: Self-pay | Admitting: Oncology

## 2023-06-20 VITALS — BP 184/106 | HR 81 | Temp 99.3°F | Resp 16 | Ht 62.0 in | Wt 230.6 lb

## 2023-06-20 DIAGNOSIS — N920 Excessive and frequent menstruation with regular cycle: Secondary | ICD-10-CM | POA: Diagnosis not present

## 2023-06-20 DIAGNOSIS — D500 Iron deficiency anemia secondary to blood loss (chronic): Secondary | ICD-10-CM

## 2023-06-20 LAB — IRON AND TIBC
Iron: 39 ug/dL (ref 28–170)
Saturation Ratios: 10 % — ABNORMAL LOW (ref 10.4–31.8)
TIBC: 402 ug/dL (ref 250–450)
UIBC: 363 ug/dL

## 2023-06-20 LAB — CBC: RBC: 4.39 (ref 3.87–5.11)

## 2023-06-20 LAB — CBC AND DIFFERENTIAL
HCT: 38 (ref 36–46)
Hemoglobin: 12.6 (ref 12.0–16.0)
Neutrophils Absolute: 3.88
Platelets: 304 10*3/uL (ref 150–400)
WBC: 6.8

## 2023-06-20 LAB — FERRITIN: Ferritin: 11 ng/mL (ref 11–307)

## 2023-06-20 NOTE — Telephone Encounter (Signed)
06/20/23 Spoke with patient and scheduled IRON AND NEXT OFFICE VISIT.

## 2023-06-21 MED FILL — Ferumoxytol Inj 510 MG/17ML (30 MG/ML) (Elemental Fe): INTRAVENOUS | Qty: 17 | Status: AC

## 2023-06-22 ENCOUNTER — Inpatient Hospital Stay: Payer: Medicaid Other

## 2023-06-22 VITALS — BP 125/74 | HR 70 | Temp 97.6°F | Resp 16

## 2023-06-22 DIAGNOSIS — D500 Iron deficiency anemia secondary to blood loss (chronic): Secondary | ICD-10-CM | POA: Diagnosis not present

## 2023-06-22 MED ORDER — SODIUM CHLORIDE 0.9 % IV SOLN
510.0000 mg | Freq: Once | INTRAVENOUS | Status: AC
  Administered 2023-06-22: 510 mg via INTRAVENOUS
  Filled 2023-06-22: qty 510

## 2023-06-22 MED ORDER — SODIUM CHLORIDE 0.9 % IV SOLN: Freq: Once | INTRAVENOUS | Status: AC

## 2023-06-22 NOTE — Patient Instructions (Signed)
 Ferumoxytol Injection What is this medication? FERUMOXYTOL (FER ue MOX i tol) treats low levels of iron in your body (iron deficiency anemia). Iron is a mineral that plays an important role in making red blood cells, which carry oxygen from your lungs to the rest of your body. This medicine may be used for other purposes; ask your health care provider or pharmacist if you have questions. COMMON BRAND NAME(S): Feraheme What should I tell my care team before I take this medication? They need to know if you have any of these conditions: Anemia not caused by low iron levels High levels of iron in the blood Magnetic resonance imaging (MRI) test scheduled An unusual or allergic reaction to iron, other medications, foods, dyes, or preservatives Pregnant or trying to get pregnant Breastfeeding How should I use this medication? This medication is injected into a vein. It is given by your care team in a hospital or clinic setting. Talk to your care team the use of this medication in children. Special care may be needed. Overdosage: If you think you have taken too much of this medicine contact a poison control center or emergency room at once. NOTE: This medicine is only for you. Do not share this medicine with others. What if I miss a dose? It is important not to miss your dose. Call your care team if you are unable to keep an appointment. What may interact with this medication? Other iron products This list may not describe all possible interactions. Give your health care provider a list of all the medicines, herbs, non-prescription drugs, or dietary supplements you use. Also tell them if you smoke, drink alcohol, or use illegal drugs. Some items may interact with your medicine. What should I watch for while using this medication? Visit your care team regularly. Tell your care team if your symptoms do not start to get better or if they get worse. You may need blood work done while you are taking this  medication. You may need to follow a special diet. Talk to your care team. Foods that contain iron include: whole grains/cereals, dried fruits, beans, or peas, leafy green vegetables, and organ meats (liver, kidney). What side effects may I notice from receiving this medication? Side effects that you should report to your care team as soon as possible: Allergic reactions--skin rash, itching, hives, swelling of the face, lips, tongue, or throat Low blood pressure--dizziness, feeling faint or lightheaded, blurry vision Shortness of breath Side effects that usually do not require medical attention (report to your care team if they continue or are bothersome): Flushing Headache Joint pain Muscle pain Nausea Pain, redness, or irritation at injection site This list may not describe all possible side effects. Call your doctor for medical advice about side effects. You may report side effects to FDA at 1-800-FDA-1088. Where should I keep my medication? This medication is given in a hospital or clinic. It will not be stored at home. NOTE: This sheet is a summary. It may not cover all possible information. If you have questions about this medicine, talk to your doctor, pharmacist, or health care provider.  2024 Elsevier/Gold Standard (2023-03-18 00:00:00)

## 2023-06-29 ENCOUNTER — Inpatient Hospital Stay: Payer: Medicaid Other | Attending: Oncology

## 2023-06-29 VITALS — BP 123/83 | HR 70 | Temp 98.0°F | Resp 18 | Ht 62.0 in | Wt 230.0 lb

## 2023-06-29 DIAGNOSIS — D500 Iron deficiency anemia secondary to blood loss (chronic): Secondary | ICD-10-CM | POA: Insufficient documentation

## 2023-06-29 MED ORDER — SODIUM CHLORIDE 0.9 % IV SOLN
510.0000 mg | Freq: Once | INTRAVENOUS | Status: AC
  Administered 2023-06-29: 510 mg via INTRAVENOUS
  Filled 2023-06-29: qty 510

## 2023-06-29 MED ORDER — SODIUM CHLORIDE 0.9 % IV SOLN: Freq: Once | INTRAVENOUS | Status: AC

## 2023-06-29 NOTE — Patient Instructions (Signed)
 Ferumoxytol Injection What is this medication? FERUMOXYTOL (FER ue MOX i tol) treats low levels of iron in your body (iron deficiency anemia). Iron is a mineral that plays an important role in making red blood cells, which carry oxygen from your lungs to the rest of your body. This medicine may be used for other purposes; ask your health care provider or pharmacist if you have questions. COMMON BRAND NAME(S): Feraheme What should I tell my care team before I take this medication? They need to know if you have any of these conditions: Anemia not caused by low iron levels High levels of iron in the blood Magnetic resonance imaging (MRI) test scheduled An unusual or allergic reaction to iron, other medications, foods, dyes, or preservatives Pregnant or trying to get pregnant Breastfeeding How should I use this medication? This medication is injected into a vein. It is given by your care team in a hospital or clinic setting. Talk to your care team the use of this medication in children. Special care may be needed. Overdosage: If you think you have taken too much of this medicine contact a poison control center or emergency room at once. NOTE: This medicine is only for you. Do not share this medicine with others. What if I miss a dose? It is important not to miss your dose. Call your care team if you are unable to keep an appointment. What may interact with this medication? Other iron products This list may not describe all possible interactions. Give your health care provider a list of all the medicines, herbs, non-prescription drugs, or dietary supplements you use. Also tell them if you smoke, drink alcohol, or use illegal drugs. Some items may interact with your medicine. What should I watch for while using this medication? Visit your care team regularly. Tell your care team if your symptoms do not start to get better or if they get worse. You may need blood work done while you are taking this  medication. You may need to follow a special diet. Talk to your care team. Foods that contain iron include: whole grains/cereals, dried fruits, beans, or peas, leafy green vegetables, and organ meats (liver, kidney). What side effects may I notice from receiving this medication? Side effects that you should report to your care team as soon as possible: Allergic reactions--skin rash, itching, hives, swelling of the face, lips, tongue, or throat Low blood pressure--dizziness, feeling faint or lightheaded, blurry vision Shortness of breath Side effects that usually do not require medical attention (report to your care team if they continue or are bothersome): Flushing Headache Joint pain Muscle pain Nausea Pain, redness, or irritation at injection site This list may not describe all possible side effects. Call your doctor for medical advice about side effects. You may report side effects to FDA at 1-800-FDA-1088. Where should I keep my medication? This medication is given in a hospital or clinic. It will not be stored at home. NOTE: This sheet is a summary. It may not cover all possible information. If you have questions about this medicine, talk to your doctor, pharmacist, or health care provider.  2024 Elsevier/Gold Standard (2023-03-18 00:00:00)

## 2023-10-20 ENCOUNTER — Other Ambulatory Visit: Payer: Self-pay

## 2023-10-20 DIAGNOSIS — D500 Iron deficiency anemia secondary to blood loss (chronic): Secondary | ICD-10-CM

## 2023-10-20 NOTE — Progress Notes (Deleted)
[facility]  [address] [place],  [place]  [address] [phone]  Clinic Day:  06/20/2023  Referring physician: Rhea Bleacher*   HISTORY OF PRESENT ILLNESS:  The patient is a 42 y.o. female who I have seen in the past for iron deficiency anemia secondary to her heavy, irregular menstrual cycles.  In the past, IV iron was very effective in replenishing her iron stores and improving her hemoglobin.  She currently has an IUD in place, which has significantly curtailed the heaviness and frequency of her menstrual cycles.  She comes in today for routine follow-up.  Since her last visit, the patient has been doing well.  She continues to deny having other overt forms of blood loss which concern her for recurrent iron deficiency anemia  PHYSICAL EXAM:  There were no vitals taken for this visit. Wt Readings from Last 3 Encounters:  06/29/23 230 lb (104.3 kg)  06/20/23 230 lb 9.6 oz (104.6 kg)  12/20/22 230 lb 12.8 oz (104.7 kg)   There is no height or weight on file to calculate BMI. Performance status (ECOG): 1 - Symptomatic but completely ambulatory Physical Exam Constitutional:      Appearance: Normal appearance. She is not ill-appearing.  HENT:     Mouth/Throat:     Mouth: Mucous membranes are moist.     Pharynx: Oropharynx is clear. No oropharyngeal exudate or posterior oropharyngeal erythema.  Cardiovascular:     Rate and Rhythm: Normal rate and regular rhythm.     Heart sounds: No murmur heard.    No friction rub. No gallop.  Pulmonary:     Effort: Pulmonary effort is normal. No respiratory distress.     Breath sounds: Normal breath sounds. No wheezing, rhonchi or rales.  Abdominal:     General: Bowel sounds are normal. There is no distension.     Palpations: Abdomen is soft. There is no mass.     Tenderness: There is no abdominal tenderness.  Musculoskeletal:        General: No swelling.     Right lower leg: No edema.     Left  lower leg: No edema.  Lymphadenopathy:     Cervical: No cervical adenopathy.     Upper Body:     Right upper body: No supraclavicular or axillary adenopathy.     Left upper body: No supraclavicular or axillary adenopathy.     Lower Body: No right inguinal adenopathy. No left inguinal adenopathy.  Skin:    General: Skin is warm.     Coloration: Skin is not jaundiced.     Findings: No lesion or rash.  Neurological:     General: No focal deficit present.     Mental Status: She is alert and oriented to person, place, and time. Mental status is at baseline.  Psychiatric:        Mood and Affect: Mood normal.        Behavior: Behavior normal.        Thought Content: Thought content normal.    LABS:     Latest Reference Range & Units 06/20/23 09:33  Iron 28 - 170 ug/dL 39  UIBC ug/dL 102  TIBC 725 - 366 ug/dL 440  Saturation Ratios 10.4 - 31.8 % 10 (L)  Ferritin 11 - 307 ng/mL 11  (L): Data is abnormally low  ASSESSMENT & PLAN:  A 42 y.o. female with iron deficiency anemia secondary to her heavy menstrual cycles.  I remain very pleased  with her hemoglobin.  However, her iron levels have clearly been falling.  Based upon this, I will arrange for her to receive IV iron over these next few weeks to rapidly replenish her iron stores and further improve her hemoglobin.  I will see her back in 4 months to reassess her iron and hemoglobin levels to see how well she responded to her upcoming IV iron.  The patient understands all the plans discussed today and is in agreement with them.   Bethlehem Langstaff Kirby Funk, MD

## 2023-10-21 ENCOUNTER — Inpatient Hospital Stay: Payer: Medicaid Other

## 2023-10-21 ENCOUNTER — Inpatient Hospital Stay: Payer: Medicaid Other | Admitting: Oncology

## 2023-11-02 NOTE — Progress Notes (Deleted)
 [facility]  [address] [place],  [place]  [address] [phone]  Clinic Day:  06/20/2023  Referring physician: Rhea Bleacher*   HISTORY OF PRESENT ILLNESS:  The patient is a 43 y.o. female who I have seen in the past for iron deficiency anemia secondary to her heavy, irregular menstrual cycles.  In the past, IV iron was very effective in replenishing her iron stores and improving her hemoglobin.  She currently has an IUD in place, which has significantly curtailed the heaviness and frequency of her menstrual cycles.  She comes in today for routine follow-up.  Since her last visit, the patient has been doing well.  She continues to deny having other overt forms of blood loss which concern her for recurrent iron deficiency anemia  PHYSICAL EXAM:  There were no vitals taken for this visit. Wt Readings from Last 3 Encounters:  06/29/23 230 lb (104.3 kg)  06/20/23 230 lb 9.6 oz (104.6 kg)  12/20/22 230 lb 12.8 oz (104.7 kg)   There is no height or weight on file to calculate BMI. Performance status (ECOG): 1 - Symptomatic but completely ambulatory Physical Exam Constitutional:      Appearance: Normal appearance. She is not ill-appearing.  HENT:     Mouth/Throat:     Mouth: Mucous membranes are moist.     Pharynx: Oropharynx is clear. No oropharyngeal exudate or posterior oropharyngeal erythema.  Cardiovascular:     Rate and Rhythm: Normal rate and regular rhythm.     Heart sounds: No murmur heard.    No friction rub. No gallop.  Pulmonary:     Effort: Pulmonary effort is normal. No respiratory distress.     Breath sounds: Normal breath sounds. No wheezing, rhonchi or rales.  Abdominal:     General: Bowel sounds are normal. There is no distension.     Palpations: Abdomen is soft. There is no mass.     Tenderness: There is no abdominal tenderness.  Musculoskeletal:        General: No swelling.     Right lower leg: No edema.     Left  lower leg: No edema.  Lymphadenopathy:     Cervical: No cervical adenopathy.     Upper Body:     Right upper body: No supraclavicular or axillary adenopathy.     Left upper body: No supraclavicular or axillary adenopathy.     Lower Body: No right inguinal adenopathy. No left inguinal adenopathy.  Skin:    General: Skin is warm.     Coloration: Skin is not jaundiced.     Findings: No lesion or rash.  Neurological:     General: No focal deficit present.     Mental Status: She is alert and oriented to person, place, and time. Mental status is at baseline.  Psychiatric:        Mood and Affect: Mood normal.        Behavior: Behavior normal.        Thought Content: Thought content normal.    LABS:     Latest Reference Range & Units 06/20/23 09:33  Iron 28 - 170 ug/dL 39  UIBC ug/dL 102  TIBC 725 - 366 ug/dL 440  Saturation Ratios 10.4 - 31.8 % 10 (L)  Ferritin 11 - 307 ng/mL 11  (L): Data is abnormally low  ASSESSMENT & PLAN:  A 43 y.o. female with iron deficiency anemia secondary to her heavy menstrual cycles.  I remain very pleased  with her hemoglobin.  However, her iron levels have clearly been falling.  Based upon this, I will arrange for her to receive IV iron over these next few weeks to rapidly replenish her iron stores and further improve her hemoglobin.  I will see her back in 4 months to reassess her iron and hemoglobin levels to see how well she responded to her upcoming IV iron.  The patient understands all the plans discussed today and is in agreement with them.   Bethle[place] Langstaff Kirby Funk, MD

## 2023-11-03 ENCOUNTER — Inpatient Hospital Stay (HOSPITAL_BASED_OUTPATIENT_CLINIC_OR_DEPARTMENT_OTHER): Payer: Medicaid Other | Admitting: Oncology

## 2023-11-03 ENCOUNTER — Inpatient Hospital Stay: Payer: Medicaid Other | Attending: Oncology

## 2023-11-03 ENCOUNTER — Other Ambulatory Visit: Payer: Self-pay | Admitting: Oncology

## 2023-11-03 VITALS — BP 180/118 | HR 77 | Temp 99.1°F | Resp 14 | Ht 62.0 in | Wt 246.7 lb

## 2023-11-03 DIAGNOSIS — N920 Excessive and frequent menstruation with regular cycle: Secondary | ICD-10-CM | POA: Diagnosis not present

## 2023-11-03 DIAGNOSIS — Z79899 Other long term (current) drug therapy: Secondary | ICD-10-CM | POA: Insufficient documentation

## 2023-11-03 DIAGNOSIS — D509 Iron deficiency anemia, unspecified: Secondary | ICD-10-CM | POA: Diagnosis present

## 2023-11-03 DIAGNOSIS — D500 Iron deficiency anemia secondary to blood loss (chronic): Secondary | ICD-10-CM

## 2023-11-03 LAB — CBC WITH DIFFERENTIAL (CANCER CENTER ONLY)
Abs Immature Granulocytes: 0.05 K/uL (ref 0.00–0.07)
Basophils Absolute: 0 K/uL (ref 0.0–0.1)
Basophils Relative: 0 %
Eosinophils Absolute: 0.2 K/uL (ref 0.0–0.5)
Eosinophils Relative: 3 %
HCT: 42.7 % (ref 36.0–46.0)
Hemoglobin: 14.8 g/dL (ref 12.0–15.0)
Immature Granulocytes: 1 %
Lymphocytes Relative: 30 %
Lymphs Abs: 2.5 K/uL (ref 0.7–4.0)
MCH: 32.7 pg (ref 26.0–34.0)
MCHC: 34.7 g/dL (ref 30.0–36.0)
MCV: 94.5 fL (ref 80.0–100.0)
Monocytes Absolute: 0.5 K/uL (ref 0.1–1.0)
Monocytes Relative: 6 %
Neutro Abs: 5 K/uL (ref 1.7–7.7)
Neutrophils Relative %: 60 %
Platelet Count: 262 K/uL (ref 150–400)
RBC: 4.52 MIL/uL (ref 3.87–5.11)
RDW: 13 % (ref 11.5–15.5)
WBC Count: 8.3 K/uL (ref 4.0–10.5)
nRBC: 0 % (ref 0.0–0.2)
nRBC: 0 /100{WBCs}

## 2023-11-03 LAB — IRON AND TIBC
Iron: 92 ug/dL (ref 28–170)
Saturation Ratios: 24 % (ref 10.4–31.8)
TIBC: 392 ug/dL (ref 250–450)
UIBC: 300 ug/dL

## 2023-11-03 LAB — FERRITIN: Ferritin: 36 ng/mL (ref 11–307)

## 2023-11-03 NOTE — Progress Notes (Signed)
 [facility]  [address] [place],  [place]  [address] [phone]  Clinic Day:  11/03/2023  Referring physician: Benson Eleanor PARAS*   HISTORY OF PRESENT ILLNESS:  The patient is a 43 y.o. female with iron  deficiency anemia secondary to her heavy, irregular menstrual cycles.  She comes in today to reassess her iron  and hemoglobin levels after receiving another course of IV iron  in September 2024.  The patient claims she has felt much better since her IV iron  was given.  Her menstrual cycles remain erratic, but she claims they have not been as heavy as they have been in the past.  She also continues to deny having other overt forms of blood loss to explain her iron  deficiency anemia    PHYSICAL EXAM:  Blood pressure (!) 180/118, pulse 77, temperature 99.1 F (37.3 C), temperature source Oral, resp. rate 14, height 5' 2 (1.575 m), weight 246 lb 11.2 oz (111.9 kg), SpO2 98%. Wt Readings from Last 3 Encounters:  11/03/23 246 lb 11.2 oz (111.9 kg)  06/29/23 230 lb (104.3 kg)  06/20/23 230 lb 9.6 oz (104.6 kg)   Body mass index is 45.12 kg/m. Performance status (ECOG): 0 - Asymptomatic Physical Exam Constitutional:      Appearance: Normal appearance. She is not ill-appearing.  HENT:     Mouth/Throat:     Mouth: Mucous membranes are moist.     Pharynx: Oropharynx is clear. No oropharyngeal exudate or posterior oropharyngeal erythema.  Cardiovascular:     Rate and Rhythm: Normal rate and regular rhythm.     Heart sounds: No murmur heard.    No friction rub. No gallop.  Pulmonary:     Effort: Pulmonary effort is normal. No respiratory distress.     Breath sounds: Normal breath sounds. No wheezing, rhonchi or rales.  Abdominal:     General: Bowel sounds are normal. There is no distension.     Palpations: Abdomen is soft. There is no mass.     Tenderness: There is no abdominal tenderness.  Musculoskeletal:        General: No swelling.      Right lower leg: No edema.     Left lower leg: No edema.  Lymphadenopathy:     Cervical: No cervical adenopathy.     Upper Body:     Right upper body: No supraclavicular or axillary adenopathy.     Left upper body: No supraclavicular or axillary adenopathy.     Lower Body: No right inguinal adenopathy. No left inguinal adenopathy.  Skin:    General: Skin is warm.     Coloration: Skin is not jaundiced.     Findings: No lesion or rash.  Neurological:     General: No focal deficit present.     Mental Status: She is alert and oriented to person, place, and time. Mental status is at baseline.  Psychiatric:        Mood and Affect: Mood normal.        Behavior: Behavior normal.        Thought Content: Thought content normal.     LABS:      Latest Ref Rng & Units 11/03/2023   10:59 AM 06/20/2023   12:00 AM 12/20/2022   12:00 AM  CBC  WBC 4.0 - 10.5 K/uL 8.3  6.8     7.1      Hemoglobin 12.0 - 15.0 g/dL 85.1  87.3     12.8  Hematocrit 36.0 - 46.0 % 42.7  38     38      Platelets 150 - 400 K/uL 262  304     270         This result is from an external source.      Latest Ref Rng & Units 09/10/2018    2:28 PM  CMP  Glucose 70 - 99 mg/dL 96   BUN 6 - 20 mg/dL 9   Creatinine 9.55 - 8.99 mg/dL 8.95   Sodium 864 - 854 mmol/L 139   Potassium 3.5 - 5.1 mmol/L 3.4   Chloride 98 - 111 mmol/L 100   CO2 22 - 32 mmol/L 28   Calcium 8.9 - 10.3 mg/dL 9.3   Total Protein 6.5 - 8.1 g/dL 8.5   Total Bilirubin 0.3 - 1.2 mg/dL 0.5   Alkaline Phos 38 - 126 U/L 61   AST 15 - 41 U/L 88   ALT 0 - 44 U/L 60     Latest Reference Range & Units 06/20/23 09:33 11/03/23 11:00  Iron  28 - 170 ug/dL 39 92  UIBC ug/dL 636 699  TIBC 749 - 549 ug/dL 597 607  Saturation Ratios 10.4 - 31.8 % 10 (L) 24  Ferritin 11 - 307 ng/mL 11 36  (L): Data is abnormally low ASSESSMENT & PLAN:   Assessment/Plan:  A 43 y.o. female with iron  deficiency anemia secondary to heavy, erratic menstrual cycles.  I am very  pleased with the improvement in both her iron  and hemoglobin levels since she received her IV iron  in September 2024.  Clinically, the patient is doing well.  Perhaps the major issue that will impact her life over these next few years is her hypertension.  She understands that it is imperative for her to get this under control as it could lead to a severe cardiovascular insult if not done.  Otherwise, as she is doing well from a hematologic standpoint, I will see her back in 6 months for repeat clinical assessment.  The patient understands all the plans discussed today and is in agreement with them.    Conni Knighton DELENA Kerns, MD

## 2024-05-01 NOTE — Progress Notes (Unsigned)
 [facility]  [address] [place],  [place]  [address] [phone]  Clinic Day:  05/02/2024  Referring physician: Benson Eleanor PARAS*   HISTORY OF PRESENT ILLNESS:  The patient is a 43 y.o. female with iron  deficiency anemia secondary to her heavy, irregular menstrual cycles.  She last received a course of IV iron  in September 2024.  The patient comes in today for routine follow-up.  Since her last visit, the patient has been doing okay.  However, she admits to having a very heavy menstrual cycle in May 2025.  She continues to deny having other overt forms of blood loss.  She has felt mildly weaker over these past few weeks.  PHYSICAL EXAM:  Blood pressure (!) 140/82, pulse 65, temperature 98.3 F (36.8 C), temperature source Oral, resp. rate 14, height 5' 2 (1.575 m), weight 247 lb 3.2 oz (112.1 kg), SpO2 95%. Wt Readings from Last 3 Encounters:  05/02/24 247 lb 3.2 oz (112.1 kg)  11/03/23 246 lb 11.2 oz (111.9 kg)  06/29/23 230 lb (104.3 kg)   Body mass index is 45.21 kg/m. Performance status (ECOG): 0 - Asymptomatic Physical Exam Constitutional:      Appearance: Normal appearance. She is not ill-appearing.  HENT:     Mouth/Throat:     Mouth: Mucous membranes are moist.     Pharynx: Oropharynx is clear. No oropharyngeal exudate or posterior oropharyngeal erythema.  Cardiovascular:     Rate and Rhythm: Normal rate and regular rhythm.     Heart sounds: No murmur heard.    No friction rub. No gallop.  Pulmonary:     Effort: Pulmonary effort is normal. No respiratory distress.     Breath sounds: Normal breath sounds. No wheezing, rhonchi or rales.  Abdominal:     General: Bowel sounds are normal. There is no distension.     Palpations: Abdomen is soft. There is no mass.     Tenderness: There is no abdominal tenderness.  Musculoskeletal:        General: No swelling.     Right lower leg: No edema.     Left lower leg: No edema.   Lymphadenopathy:     Cervical: No cervical adenopathy.     Upper Body:     Right upper body: No supraclavicular or axillary adenopathy.     Left upper body: No supraclavicular or axillary adenopathy.     Lower Body: No right inguinal adenopathy. No left inguinal adenopathy.  Skin:    General: Skin is warm.     Coloration: Skin is not jaundiced.     Findings: No lesion or rash.  Neurological:     General: No focal deficit present.     Mental Status: She is alert and oriented to person, place, and time. Mental status is at baseline.  Psychiatric:        Mood and Affect: Mood normal.        Behavior: Behavior normal.        Thought Content: Thought content normal.     LABS:      Latest Ref Rng & Units 05/02/2024   10:20 AM 11/03/2023   10:59 AM 06/20/2023   12:00 AM  CBC  WBC 4.0 - 10.5 K/uL 6.0  8.3  6.8      Hemoglobin 12.0 - 15.0 g/dL 88.4  85.1  87.3      Hematocrit 36.0 - 46.0 % 36.7  42.7  38      Platelets  150 - 400 K/uL 325  262  304         This result is from an external source.      Latest Ref Rng & Units 09/10/2018    2:28 PM  CMP  Glucose 70 - 99 mg/dL 96   BUN 6 - 20 mg/dL 9   Creatinine 9.55 - 8.99 mg/dL 8.95   Sodium 864 - 854 mmol/L 139   Potassium 3.5 - 5.1 mmol/L 3.4   Chloride 98 - 111 mmol/L 100   CO2 22 - 32 mmol/L 28   Calcium 8.9 - 10.3 mg/dL 9.3   Total Protein 6.5 - 8.1 g/dL 8.5   Total Bilirubin 0.3 - 1.2 mg/dL 0.5   Alkaline Phos 38 - 126 U/L 61   AST 15 - 41 U/L 88   ALT 0 - 44 U/L 60     Latest Reference Range & Units 05/02/24 10:20  Iron  28 - 170 ug/dL 50  UIBC ug/dL 644  TIBC 749 - 549 ug/dL 594  Saturation Ratios 10.4 - 31.8 % 12  Ferritin 11 - 307 ng/mL 8 (L)  (L): Data is abnormally low  ASSESSMENT & PLAN:  Assessment/Plan:  A 43 y.o. female with iron  deficiency anemia secondary to heavy, erratic menstrual cycles.  Her low ferritin and decreasing hemoglobin are consistent with her having recurrent iron  deficiency anemia.   Based upon this, I will arrange for her to receive a course of IV iron  over these next few weeks to rapidly replenish her iron  stores and improve her hemoglobin.  I will see her back in 3 months to see how well she responded to her upcoming IV iron .  The patient understands all the plans discussed today and is in agreement with them.    Safia Panzer DELENA Kerns, MD

## 2024-05-02 ENCOUNTER — Encounter: Payer: Self-pay | Admitting: Oncology

## 2024-05-02 ENCOUNTER — Inpatient Hospital Stay: Payer: Medicaid Other | Attending: Oncology | Admitting: Oncology

## 2024-05-02 ENCOUNTER — Other Ambulatory Visit: Payer: Self-pay | Admitting: Oncology

## 2024-05-02 ENCOUNTER — Telehealth: Payer: Self-pay

## 2024-05-02 ENCOUNTER — Inpatient Hospital Stay: Payer: Medicaid Other

## 2024-05-02 VITALS — BP 140/82 | HR 65 | Temp 98.3°F | Resp 14 | Ht 62.0 in | Wt 247.2 lb

## 2024-05-02 DIAGNOSIS — D500 Iron deficiency anemia secondary to blood loss (chronic): Secondary | ICD-10-CM | POA: Diagnosis not present

## 2024-05-02 DIAGNOSIS — D509 Iron deficiency anemia, unspecified: Secondary | ICD-10-CM | POA: Diagnosis present

## 2024-05-02 LAB — CBC WITH DIFFERENTIAL (CANCER CENTER ONLY)
Abs Immature Granulocytes: 0.03 K/uL (ref 0.00–0.07)
Basophils Absolute: 0 K/uL (ref 0.0–0.1)
Basophils Relative: 0 %
Eosinophils Absolute: 0.2 K/uL (ref 0.0–0.5)
Eosinophils Relative: 3 %
HCT: 36.7 % (ref 36.0–46.0)
Hemoglobin: 11.5 g/dL — ABNORMAL LOW (ref 12.0–15.0)
Immature Granulocytes: 1 %
Lymphocytes Relative: 33 %
Lymphs Abs: 1.9 K/uL (ref 0.7–4.0)
MCH: 27.4 pg (ref 26.0–34.0)
MCHC: 31.3 g/dL (ref 30.0–36.0)
MCV: 87.4 fL (ref 80.0–100.0)
Monocytes Absolute: 0.4 K/uL (ref 0.1–1.0)
Monocytes Relative: 7 %
Neutro Abs: 3.4 K/uL (ref 1.7–7.7)
Neutrophils Relative %: 56 %
Platelet Count: 325 K/uL (ref 150–400)
RBC: 4.2 MIL/uL (ref 3.87–5.11)
RDW: 13.8 % (ref 11.5–15.5)
WBC Count: 6 K/uL (ref 4.0–10.5)
nRBC: 0 % (ref 0.0–0.2)

## 2024-05-02 LAB — IRON AND TIBC
Iron: 50 ug/dL (ref 28–170)
Saturation Ratios: 12 % (ref 10.4–31.8)
TIBC: 405 ug/dL (ref 250–450)
UIBC: 355 ug/dL

## 2024-05-02 LAB — FERRITIN: Ferritin: 8 ng/mL — ABNORMAL LOW (ref 11–307)

## 2024-05-02 NOTE — Telephone Encounter (Signed)
 Dr Ezzard: Please arrange for IV iron  to be given over these next few weeks....thx   Latest Reference Range & Units 05/02/24 10:20  Iron  28 - 170 ug/dL 50  UIBC ug/dL 644  TIBC 749 - 549 ug/dL 594  Saturation Ratios 10.4 - 31.8 % 12  Ferritin 11 - 307 ng/mL 8 (L)  (L): Data is abnormally low

## 2024-05-03 ENCOUNTER — Telehealth: Payer: Self-pay | Admitting: Oncology

## 2024-05-03 NOTE — Telephone Encounter (Signed)
 Patient has been scheduled for follow-up visit per 05/02/24 LOS.  Pt aware of scheduled appt details.

## 2024-05-08 ENCOUNTER — Inpatient Hospital Stay

## 2024-05-08 VITALS — BP 134/88 | HR 65 | Temp 98.0°F | Resp 18

## 2024-05-08 DIAGNOSIS — D509 Iron deficiency anemia, unspecified: Secondary | ICD-10-CM | POA: Diagnosis not present

## 2024-05-08 DIAGNOSIS — D500 Iron deficiency anemia secondary to blood loss (chronic): Secondary | ICD-10-CM

## 2024-05-08 MED ORDER — SODIUM CHLORIDE 0.9 % IV SOLN: Freq: Once | INTRAVENOUS | Status: AC

## 2024-05-08 MED ORDER — SODIUM CHLORIDE 0.9 % IV SOLN
510.0000 mg | Freq: Once | INTRAVENOUS | Status: AC
  Administered 2024-05-08: 510 mg via INTRAVENOUS
  Filled 2024-05-08: qty 510

## 2024-05-08 NOTE — Patient Instructions (Signed)

## 2024-05-14 ENCOUNTER — Inpatient Hospital Stay

## 2024-05-14 VITALS — BP 147/87 | HR 70 | Temp 98.4°F | Resp 20

## 2024-05-14 DIAGNOSIS — D500 Iron deficiency anemia secondary to blood loss (chronic): Secondary | ICD-10-CM

## 2024-05-14 DIAGNOSIS — D509 Iron deficiency anemia, unspecified: Secondary | ICD-10-CM | POA: Diagnosis not present

## 2024-05-14 MED ORDER — SODIUM CHLORIDE 0.9 % IV SOLN
Freq: Once | INTRAVENOUS | Status: AC
Start: 2024-05-14 — End: 2024-05-14

## 2024-05-14 MED ORDER — SODIUM CHLORIDE 0.9 % IV SOLN
510.0000 mg | Freq: Once | INTRAVENOUS | Status: AC
Start: 2024-05-14 — End: 2024-05-14
  Administered 2024-05-14: 510 mg via INTRAVENOUS
  Filled 2024-05-14: qty 510

## 2024-05-14 NOTE — Patient Instructions (Signed)

## 2024-08-01 ENCOUNTER — Other Ambulatory Visit: Payer: Self-pay | Admitting: Hematology and Oncology

## 2024-08-01 DIAGNOSIS — D500 Iron deficiency anemia secondary to blood loss (chronic): Secondary | ICD-10-CM

## 2024-08-02 ENCOUNTER — Inpatient Hospital Stay

## 2024-08-02 ENCOUNTER — Inpatient Hospital Stay: Admitting: Hematology and Oncology

## 2024-08-09 ENCOUNTER — Telehealth: Payer: Self-pay | Admitting: Hematology and Oncology

## 2024-08-09 ENCOUNTER — Inpatient Hospital Stay: Admitting: Hematology and Oncology

## 2024-08-09 ENCOUNTER — Inpatient Hospital Stay: Attending: Hematology and Oncology

## 2024-08-09 ENCOUNTER — Other Ambulatory Visit: Payer: Self-pay

## 2024-08-09 VITALS — BP 150/86 | HR 70 | Resp 16 | Ht 62.0 in | Wt 256.6 lb

## 2024-08-09 DIAGNOSIS — N920 Excessive and frequent menstruation with regular cycle: Secondary | ICD-10-CM | POA: Diagnosis not present

## 2024-08-09 DIAGNOSIS — Z79899 Other long term (current) drug therapy: Secondary | ICD-10-CM | POA: Insufficient documentation

## 2024-08-09 DIAGNOSIS — D500 Iron deficiency anemia secondary to blood loss (chronic): Secondary | ICD-10-CM

## 2024-08-09 LAB — CBC WITH DIFFERENTIAL (CANCER CENTER ONLY)
Abs Immature Granulocytes: 0.04 K/uL (ref 0.00–0.07)
Basophils Absolute: 0 K/uL (ref 0.0–0.1)
Basophils Relative: 0 %
Eosinophils Absolute: 0.2 K/uL (ref 0.0–0.5)
Eosinophils Relative: 2 %
HCT: 38.7 % (ref 36.0–46.0)
Hemoglobin: 13 g/dL (ref 12.0–15.0)
Immature Granulocytes: 1 %
Lymphocytes Relative: 30 %
Lymphs Abs: 2.1 K/uL (ref 0.7–4.0)
MCH: 31.1 pg (ref 26.0–34.0)
MCHC: 33.6 g/dL (ref 30.0–36.0)
MCV: 92.6 fL (ref 80.0–100.0)
Monocytes Absolute: 0.5 K/uL (ref 0.1–1.0)
Monocytes Relative: 7 %
Neutro Abs: 4.1 K/uL (ref 1.7–7.7)
Neutrophils Relative %: 60 %
Platelet Count: 295 K/uL (ref 150–400)
RBC: 4.18 MIL/uL (ref 3.87–5.11)
RDW: 14.1 % (ref 11.5–15.5)
WBC Count: 6.8 K/uL (ref 4.0–10.5)
nRBC: 0 % (ref 0.0–0.2)

## 2024-08-09 LAB — TSH: TSH: 1.62 u[IU]/mL (ref 0.350–4.500)

## 2024-08-09 LAB — FERRITIN: Ferritin: 43 ng/mL (ref 11–307)

## 2024-08-09 LAB — CMP (CANCER CENTER ONLY)
ALT: 17 U/L (ref 0–44)
AST: 25 U/L (ref 15–41)
Albumin: 4.2 g/dL (ref 3.5–5.0)
Alkaline Phosphatase: 56 U/L (ref 38–126)
Anion gap: 10 (ref 5–15)
BUN: 12 mg/dL (ref 6–20)
CO2: 25 mmol/L (ref 22–32)
Calcium: 9.3 mg/dL (ref 8.9–10.3)
Chloride: 105 mmol/L (ref 98–111)
Creatinine: 0.8 mg/dL (ref 0.44–1.00)
GFR, Estimated: >60 mL/min (ref >60)
Glucose, Bld: 112 mg/dL — ABNORMAL HIGH (ref 70–99)
Potassium: 4 mmol/L (ref 3.5–5.1)
Sodium: 140 mmol/L (ref 135–145)
Total Bilirubin: 0.3 mg/dL (ref 0.0–1.2)
Total Protein: 7.5 g/dL (ref 6.5–8.1)

## 2024-08-09 LAB — IRON AND TIBC
Iron: 62 ug/dL (ref 28–170)
Saturation Ratios: 18 % (ref 10.4–31.8)
TIBC: 335 ug/dL (ref 250–450)
UIBC: 273 ug/dL

## 2024-08-09 LAB — FOLATE: Folate: 7.8 ng/mL (ref >5.9)

## 2024-08-09 LAB — VITAMIN B12: Vitamin B-12: 622 pg/mL (ref 180–914)

## 2024-08-09 NOTE — Progress Notes (Signed)
 [facility]  [address] [place],  [place]  [address] [phone]  Clinic Day:  08/09/2024  Referring physician: Benson Eleanor PARAS*   HISTORY OF PRESENT ILLNESS:  The patient is a 43 y.o. female with iron  deficiency anemia secondary to her heavy, irregular menstrual cycles.  She last received a course of IV iron  in September 2024.  The patient comes in today for routine follow-up. 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 In regards to her anemia, she denies any overt signs of blood loss or symptoms.   PHYSICAL EXAM:  There were no vitals taken for this visit. Wt Readings from Last 3 Encounters:  05/02/24 247 lb 3.2 oz (112.1 kg)  11/03/23 246 lb 11.2 oz (111.9 kg)  06/29/23 230 lb (104.3 kg)   There is no height or weight on file to calculate BMI. Performance status (ECOG): 0 - Asymptomatic Physical Exam Constitutional:      Appearance: Normal appearance. She is not ill-appearing.  HENT:     Mouth/Throat:     Mouth: Mucous membranes are moist.     Pharynx: Oropharynx is clear. No oropharyngeal exudate or posterior oropharyngeal erythema.  Cardiovascular:     Rate and Rhythm: Normal rate and regular rhythm.     Heart sounds: No murmur heard.    No friction rub. No gallop.  Pulmonary:     Effort: Pulmonary effort is normal. No respiratory distress.     Breath sounds: Normal breath sounds. No wheezing, rhonchi or rales.  Abdominal:     General: Bowel sounds are normal. There is no distension.     Palpations: Abdomen is soft. There is no mass.     Tenderness: There is no abdominal tenderness.  Musculoskeletal:        General: No swelling.     Right lower leg: No edema.     Left lower leg: No edema.  Lymphadenopathy:     Cervical: No cervical adenopathy.     Upper  Body:     Right upper body: No supraclavicular or axillary adenopathy.     Left upper body: No supraclavicular or axillary adenopathy.     Lower Body: No right inguinal adenopathy. No left inguinal adenopathy.  Skin:    General: Skin is warm.     Coloration: Skin is not jaundiced.     Findings: No lesion or rash.  Neurological:     General: No focal deficit present.     Mental Status: She is alert and oriented to person, place, and time. Mental status is at baseline.  Psychiatric:        Mood and Affect: Mood normal.        Behavior: Behavior normal.        Thought Content: Thought content normal.     LABS:      Latest Ref Rng & Units 08/09/2024   10:26 AM 05/02/2024   10:20 AM 11/03/2023   10:59 AM  CBC  WBC 4.0 - 10.5 K/uL 6.8  6.0  8.3   Hemoglobin 12.0 - 15.0 g/dL 86.9  88.4  85.1   Hematocrit 36.0 - 46.0 % 38.7  36.7  42.7   Platelets 150 - 400 K/uL 295  325  262       Latest Ref Rng & Units 09/10/2018    2:28 PM  CMP  Glucose 70 - 99 mg/dL 96   BUN 6 - 20 mg/dL 9   Creatinine 9.55 - 8.99 mg/dL 8.95   Sodium 864 - 854 mmol/L 139   Potassium 3.5 - 5.1 mmol/L 3.4   Chloride 98 - 111 mmol/L 100   CO2 22 - 32 mmol/L 28   Calcium 8.9 - 10.3 mg/dL 9.3   Total Protein 6.5 - 8.1 g/dL 8.5   Total Bilirubin 0.3 - 1.2 mg/dL 0.5   Alkaline Phos 38 - 126 U/L 61   AST 15 - 41 U/L 88   ALT 0 - 44 U/L 60     Latest Reference Range & Units 05/02/24 10:20  Iron  28 - 170 ug/dL 50  UIBC ug/dL 644  TIBC 749 - 549 ug/dL 594  Saturation Ratios 10.4 - 31.8 % 12  Ferritin 11 - 307 ng/mL 8 (L)  (L): Data is abnormally low  ASSESSMENT & PLAN:  Assessment/Plan:  A 43 y.o. female with iron  deficiency anemia secondary to heavy, erratic menstrual cycles. She has felt well in regards to her anemia.  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 We will have her return in 3 months for repeat evaluation. The patient understands all the plans discussed today and is in agreement with them.    Eleanor DELENA Bach, NP

## 2024-08-09 NOTE — Telephone Encounter (Signed)
 Patient has been scheduled for follow-up visit per 08/09/24 LOS.  Pt given an appt calendar with date and time.

## 2024-11-08 ENCOUNTER — Other Ambulatory Visit: Payer: Self-pay | Admitting: Oncology

## 2024-11-08 DIAGNOSIS — D500 Iron deficiency anemia secondary to blood loss (chronic): Secondary | ICD-10-CM

## 2024-11-08 NOTE — Progress Notes (Signed)
 " [facility] [address] [place],  [place]  [address] [phone]  Clinic Day:  11/09/2024  Referring physician: Benson Eleanor Rung, NP   HISTORY OF PRESENT ILLNESS:  The patient is a 44 y.o. female with iron  deficiency anemia secondary to her heavy, irregular menstrual cycles.  She last received a course of IV iron  in September 2024.  The patient comes in today for routine follow-up. 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  She has recovered well from the surgery.  She is scheduled for scans in the forthcoming weeks in [place] to ensure there is no evidence of local disease recurrence.  With respect to her iron  deficiency anemia, she denies that her menstrual cycles have been particularly heavy.  She also denies having other overt forms of blood loss.  PHYSICAL EXAM:  Blood pressure (!) 176/103, pulse 79, temperature 98.2 F (36.8 C), temperature source Oral, resp. rate 16, height 5' 2 (1.575 m), weight 252 lb 11.2 oz (114.6 kg), SpO2 93%. Wt Readings from Last 3 Encounters:  11/09/24 252 lb 11.2 oz (114.6 kg)  08/09/24 256 lb 9.6 oz (116.4 kg)  05/02/24 247 lb 3.2 oz (112.1 kg)   Body mass index is 46.22 kg/m. Performance status (ECOG): 0 - Asymptomatic Physical Exam Constitutional:      Appearance: Normal appearance. She is not ill-appearing.  HENT:     Mouth/Throat:     Mouth: Mucous membranes are moist.     Pharynx: Oropharynx is clear. No oropharyngeal exudate or posterior oropharyngeal erythema.  Cardiovascular:     Rate and Rhythm: Normal rate and regular rhythm.     Heart sounds: No murmur heard.    No friction rub. No gallop.  Pulmonary:     Effort: Pulmonary effort is normal. No respiratory distress.     Breath sounds: Normal breath sounds. No wheezing, rhonchi or rales.  Abdominal:     General: Bowel sounds are normal. There is no  distension.     Palpations: Abdomen is soft. There is no mass.     Tenderness: There is no abdominal tenderness.  Musculoskeletal:        General: No swelling.     Right lower leg: No edema.     Left lower leg: No edema.  Lymphadenopathy:     Cervical: No cervical adenopathy.     Upper Body:     Right upper body: No supraclavicular or axillary adenopathy.     Left upper body: No supraclavicular or axillary adenopathy.     Lower Body: No right inguinal adenopathy. No left inguinal adenopathy.  Skin:    General: Skin is warm.     Coloration: Skin is not jaundiced.     Findings: No lesion or rash.  Neurological:     General: No focal deficit present.     Mental Status: She is alert and oriented to person, place, and time. Mental status is at baseline.  Psychiatric:        Mood and Affect: Mood normal.        Behavior: Behavior normal.        Thought Content: Thought content normal.    LABS:      Latest Ref Rng & Units 11/09/2024    1:16 PM 08/09/2024   10:26 AM 05/02/2024   10:20 AM  CBC  WBC 4.0 - 10.5 K/uL 6.0  6.8  6.0  Hemoglobin 12.0 - 15.0 g/dL 87.7  86.9  88.4   Hematocrit 36.0 - 46.0 % 38.4  38.7  36.7   Platelets 150 - 400 K/uL 295  295  325       Latest Ref Rng & Units 11/09/2024    1:16 PM 08/09/2024   10:26 AM 09/10/2018    2:28 PM  CMP  Glucose 70 - 99 mg/dL 857  887  96   BUN 6 - 20 mg/dL 13  12  9    Creatinine 0.44 - 1.00 mg/dL 9.11  9.19  8.95   Sodium 135 - 145 mmol/L 139  140  139   Potassium 3.5 - 5.1 mmol/L 3.8  4.0  3.4   Chloride 98 - 111 mmol/L 104  105  100   CO2 22 - 32 mmol/L 24  25  28    Calcium 8.9 - 10.3 mg/dL 9.3  9.3  9.3   Total Protein 6.5 - 8.1 g/dL 7.5  7.5  8.5   Total Bilirubin 0.0 - 1.2 mg/dL 0.3  0.3  0.5   Alkaline Phos 38 - 126 U/L 71  56  61   AST 15 - 41 U/L 24  25  88   ALT 0 - 44 U/L 17  17  60     Latest Reference Range & Units 11/09/24 13:16  Iron  28 - 170 ug/dL 47  UIBC ug/dL 658  TIBC 749 - 549 ug/dL 611   Saturation Ratios 10.4 - 31.8 % 12  Ferritin 11 - 307 ng/mL 26   ASSESSMENT & PLAN:  Assessment/Plan:  A 44 y.o. female with iron  deficiency anemia secondary to previously heavy, erratic menstrual cycles.  I am pleased that her iron  and hemoglobin levels remain fine today.  Clinically, she appears to be doing well.  I will see her back in 6 months for repeat clinical assessment.  The patient understands all the plans discussed today and is in agreement with them.    Kinnick Maus DELENA Kerns, MD       "

## 2024-11-09 ENCOUNTER — Other Ambulatory Visit: Payer: Self-pay | Admitting: Oncology

## 2024-11-09 ENCOUNTER — Inpatient Hospital Stay: Attending: Hematology and Oncology | Admitting: Oncology

## 2024-11-09 ENCOUNTER — Inpatient Hospital Stay

## 2024-11-09 ENCOUNTER — Telehealth: Payer: Self-pay | Admitting: Oncology

## 2024-11-09 VITALS — BP 176/103 | HR 79 | Temp 98.2°F | Resp 16 | Ht 62.0 in | Wt 252.7 lb

## 2024-11-09 DIAGNOSIS — D500 Iron deficiency anemia secondary to blood loss (chronic): Secondary | ICD-10-CM

## 2024-11-09 LAB — CBC WITH DIFFERENTIAL (CANCER CENTER ONLY)
Abs Immature Granulocytes: 0.02 K/uL (ref 0.00–0.07)
Basophils Absolute: 0 K/uL (ref 0.0–0.1)
Basophils Relative: 0 %
Eosinophils Absolute: 0.2 K/uL (ref 0.0–0.5)
Eosinophils Relative: 3 %
HCT: 38.4 % (ref 36.0–46.0)
Hemoglobin: 12.2 g/dL (ref 12.0–15.0)
Immature Granulocytes: 0 %
Lymphocytes Relative: 29 %
Lymphs Abs: 1.7 K/uL (ref 0.7–4.0)
MCH: 28.5 pg (ref 26.0–34.0)
MCHC: 31.8 g/dL (ref 30.0–36.0)
MCV: 89.7 fL (ref 80.0–100.0)
Monocytes Absolute: 0.4 K/uL (ref 0.1–1.0)
Monocytes Relative: 6 %
Neutro Abs: 3.7 K/uL (ref 1.7–7.7)
Neutrophils Relative %: 62 %
Platelet Count: 295 K/uL (ref 150–400)
RBC: 4.28 MIL/uL (ref 3.87–5.11)
RDW: 13.3 % (ref 11.5–15.5)
WBC Count: 6 K/uL (ref 4.0–10.5)
nRBC: 0 % (ref 0.0–0.2)

## 2024-11-09 LAB — IRON AND TIBC
Iron: 47 ug/dL (ref 28–170)
Saturation Ratios: 12 % (ref 10.4–31.8)
TIBC: 388 ug/dL (ref 250–450)
UIBC: 341 ug/dL

## 2024-11-09 LAB — CMP (CANCER CENTER ONLY)
ALT: 17 U/L (ref 0–44)
AST: 24 U/L (ref 15–41)
Albumin: 4.3 g/dL (ref 3.5–5.0)
Alkaline Phosphatase: 71 U/L (ref 38–126)
Anion gap: 11 (ref 5–15)
BUN: 13 mg/dL (ref 6–20)
CO2: 24 mmol/L (ref 22–32)
Calcium: 9.3 mg/dL (ref 8.9–10.3)
Chloride: 104 mmol/L (ref 98–111)
Creatinine: 0.88 mg/dL (ref 0.44–1.00)
GFR, Estimated: >60 mL/min
Glucose, Bld: 142 mg/dL — ABNORMAL HIGH (ref 70–99)
Potassium: 3.8 mmol/L (ref 3.5–5.1)
Sodium: 139 mmol/L (ref 135–145)
Total Bilirubin: 0.3 mg/dL (ref 0.0–1.2)
Total Protein: 7.5 g/dL (ref 6.5–8.1)

## 2024-11-09 LAB — FERRITIN: Ferritin: 26 ng/mL (ref 11–307)

## 2024-11-09 NOTE — Telephone Encounter (Signed)
 Patient has been scheduled for follow-up visit per 11/09/2024 LOS.  Pt aware of scheduled appt details.

## 2024-11-11 ENCOUNTER — Encounter: Payer: Self-pay | Admitting: Oncology

## 2025-05-09 ENCOUNTER — Inpatient Hospital Stay

## 2025-05-09 ENCOUNTER — Inpatient Hospital Stay: Admitting: Oncology
